# Patient Record
Sex: Female | Born: 1965 | Race: Black or African American | Hispanic: No | Marital: Married | Smoking: Never smoker
Health system: Southern US, Community
[De-identification: ages and names within clinical notes are randomized; demographics above are authoritative.]

## PROBLEM LIST (undated history)

## (undated) DIAGNOSIS — N6019 Diffuse cystic mastopathy of unspecified breast: Secondary | ICD-10-CM

## (undated) DIAGNOSIS — R600 Localized edema: Secondary | ICD-10-CM

## (undated) DIAGNOSIS — I10 Essential (primary) hypertension: Secondary | ICD-10-CM

## (undated) DIAGNOSIS — R609 Edema, unspecified: Secondary | ICD-10-CM

## (undated) HISTORY — DX: Morbid (severe) obesity due to excess calories: E66.01

## (undated) HISTORY — DX: Localized edema: R60.0

## (undated) HISTORY — DX: Essential (primary) hypertension: I10

## (undated) HISTORY — DX: Edema, unspecified: R60.9

## (undated) HISTORY — PX: REDUCTION MAMMAPLASTY: SUR839

## (undated) HISTORY — DX: Diffuse cystic mastopathy of unspecified breast: N60.19

---

## 1998-08-18 ENCOUNTER — Other Ambulatory Visit: Admission: RE | Admit: 1998-08-18 | Discharge: 1998-08-18 | Payer: Self-pay | Admitting: Gynecology

## 1998-08-18 ENCOUNTER — Encounter (INDEPENDENT_AMBULATORY_CARE_PROVIDER_SITE_OTHER): Payer: Self-pay | Admitting: Specialist

## 1998-09-12 ENCOUNTER — Encounter (INDEPENDENT_AMBULATORY_CARE_PROVIDER_SITE_OTHER): Payer: Self-pay | Admitting: Specialist

## 1998-09-12 ENCOUNTER — Ambulatory Visit (HOSPITAL_COMMUNITY): Admission: RE | Admit: 1998-09-12 | Discharge: 1998-09-12 | Payer: Self-pay | Admitting: Obstetrics and Gynecology

## 1998-11-27 ENCOUNTER — Ambulatory Visit (HOSPITAL_COMMUNITY): Admission: RE | Admit: 1998-11-27 | Discharge: 1998-11-27 | Payer: Self-pay | Admitting: Family Medicine

## 1998-11-27 ENCOUNTER — Encounter: Payer: Self-pay | Admitting: Family Medicine

## 1999-02-12 ENCOUNTER — Observation Stay (HOSPITAL_COMMUNITY): Admission: RE | Admit: 1999-02-12 | Discharge: 1999-02-13 | Payer: Self-pay | Admitting: Gynecology

## 1999-02-12 ENCOUNTER — Encounter (INDEPENDENT_AMBULATORY_CARE_PROVIDER_SITE_OTHER): Payer: Self-pay | Admitting: Specialist

## 2000-01-12 HISTORY — PX: ABDOMINAL HYSTERECTOMY: SHX81

## 2001-09-12 ENCOUNTER — Emergency Department (HOSPITAL_COMMUNITY): Admission: EM | Admit: 2001-09-12 | Discharge: 2001-09-13 | Payer: Self-pay | Admitting: Emergency Medicine

## 2001-09-13 ENCOUNTER — Encounter: Payer: Self-pay | Admitting: Emergency Medicine

## 2005-05-04 ENCOUNTER — Emergency Department (HOSPITAL_COMMUNITY): Admission: EM | Admit: 2005-05-04 | Discharge: 2005-05-04 | Payer: Self-pay | Admitting: Emergency Medicine

## 2006-09-20 ENCOUNTER — Ambulatory Visit: Payer: Self-pay | Admitting: Family Medicine

## 2006-09-21 ENCOUNTER — Encounter: Admission: RE | Admit: 2006-09-21 | Discharge: 2006-09-21 | Payer: Self-pay | Admitting: Family Medicine

## 2006-10-12 ENCOUNTER — Encounter: Admission: RE | Admit: 2006-10-12 | Discharge: 2006-10-12 | Payer: Self-pay | Admitting: Family Medicine

## 2006-10-12 LAB — HM MAMMOGRAPHY: HM Mammogram: NEGATIVE

## 2007-05-17 ENCOUNTER — Ambulatory Visit: Payer: Self-pay | Admitting: Family Medicine

## 2007-05-17 ENCOUNTER — Other Ambulatory Visit: Admission: RE | Admit: 2007-05-17 | Discharge: 2007-05-17 | Payer: Self-pay | Admitting: Family Medicine

## 2007-08-28 ENCOUNTER — Ambulatory Visit: Payer: Self-pay | Admitting: Family Medicine

## 2007-12-28 ENCOUNTER — Ambulatory Visit: Payer: Self-pay | Admitting: Family Medicine

## 2008-08-06 ENCOUNTER — Other Ambulatory Visit: Admission: RE | Admit: 2008-08-06 | Discharge: 2008-08-06 | Payer: Self-pay | Admitting: Family Medicine

## 2008-08-06 ENCOUNTER — Ambulatory Visit: Payer: Self-pay | Admitting: Family Medicine

## 2009-03-08 ENCOUNTER — Ambulatory Visit: Payer: Self-pay | Admitting: Diagnostic Radiology

## 2009-03-08 ENCOUNTER — Emergency Department (HOSPITAL_BASED_OUTPATIENT_CLINIC_OR_DEPARTMENT_OTHER): Admission: EM | Admit: 2009-03-08 | Discharge: 2009-03-09 | Payer: Self-pay | Admitting: Emergency Medicine

## 2009-03-12 ENCOUNTER — Ambulatory Visit (HOSPITAL_BASED_OUTPATIENT_CLINIC_OR_DEPARTMENT_OTHER): Admission: RE | Admit: 2009-03-12 | Discharge: 2009-03-12 | Payer: Self-pay | Admitting: Orthopedic Surgery

## 2009-03-12 ENCOUNTER — Ambulatory Visit: Payer: Self-pay | Admitting: Diagnostic Radiology

## 2009-10-28 ENCOUNTER — Ambulatory Visit: Payer: Self-pay | Admitting: Family Medicine

## 2010-02-02 ENCOUNTER — Encounter: Payer: Self-pay | Admitting: Family Medicine

## 2010-05-29 NOTE — Op Note (Signed)
[facility]  Patient:    Courtney Bolton                        MRN: [id number] Proc. Date: 02/12/99 Adm. Date:  [id number] Attending:  Merrily Pew Dictator:   Nadyne Coombes. Fontaine, M.D.                           Operative Report  PREOPERATIVE DIAGNOSES: 1. Menometrorrhagia. 2. Left Bartholin cyst.  POSTOPERATIVE DIAGNOSES: 1. Menometrorrhagia. 2. Left Bartholin cyst.  PROCEDURE:  Total vaginal hysterectomy, marsupialization left Bartholin cyst.  SURGEON:  Dr. Audie Box.  ASSISTANT:  Daniel L. Eda Paschal, M.D.  ANESTHESIA:  General endotracheal anesthesia.  COMPLICATIONS:  None.  ESTIMATED BLOOD LOSS:  Minimal, less than 100 cc.  SPECIMEN:  Uterus, Bartholin cyst biopsy.  FINDINGS:  Uterus overall grossly normal.  Right and left ovaries grossly normal. Right and left fallopian tube segments grossly normal.  Left Bartholin cyst with greenish cyst fluid.  DESCRIPTION OF PROCEDURE:  The patient was taken to the operating room, underwent general endotracheal anesthesia.  Was placed in the dorsolithotomy position. Received a perineal vaginal preparation with Betadine scrub and Betadine solution. The bladder was emptied with in-and-out Foley catheterization, and the patient as draped in the usual fashion.  Cervix was visualized, grasped with the tenaculum, and circumferentially injected with lidocaine with epinephrine.  The cervical mucosa was then sharply incised circumferentially, and the anterior posterior cul-de-sac planes were developed sharply.  The posterior cul-de-sac was then sharply entered without difficulty.  A long weighted speculum was placed, and the right and left uterosacral ligaments were clamped, cut, and ligated using 0 Vicryl suture.  The anterior cul-de-sac was progressively developed through sharp dissection, and ultimately sharply entered without difficulty.  The uterus was progressively freed from its  attachments through clamping, cutting, and ligated of the parametrial tissues and cardinal ligaments using 0 Vicryl suture.  Both the  right and left uterine vessels were identified and doubly ligated using 0 Vicryl suture.  The uterus was then delivered through the vagina.  The uterine ovarian  pedicles clamped, cut, and the uterus removed.  The right and left ovarian pedicles were then doubly ligated using 0 Vicryl suture, achieving hemostasis.  The long  weighted speculum was replaced with a short weighted speculum, and the posterior vaginal mucosa was run using 0 Vicryl suture from uterosacral to uterosacral ligament in a running interlocking stitch.  A McCalls culdoplasty-type stitch using 2-0 Vicryl was then placed.  The vagina was closed anterior to posterior using interrupted 0 Vicryl figure-of-eight sutures, and the McCalls stitch tied. Adequate hemostasis was visualized.  The vagina copiously irrigated, and attention was then turned to the left Bartholin cyst.  The cyst was identified, and the vaginal mucosa overlying the cyst was sharply incised.  The cyst was then entered with greenish cyst fluid extruding, and palpation of the cyst cavity showed a simple cyst.  A representative biopsy of he cyst mucosa was taken, and subsequently the cyst wall was sutured to the overlying vaginal mucosa in a running interlocking stitch to marsupialize the cyst. Adequate hemostasis was visualized.  Again, the vagina was copiously irrigated.  A Foley  catheter was placed.  Clear free-flowing urine noted.  The patient placed in the supine position, awakened without difficulty, and taken to the recovery room in  good condition. DD:  02/12/99 TD:  02/12/99 Job: [id number]

## 2010-06-18 ENCOUNTER — Other Ambulatory Visit: Payer: Self-pay | Admitting: Family Medicine

## 2010-06-18 DIAGNOSIS — I10 Essential (primary) hypertension: Secondary | ICD-10-CM

## 2010-07-20 ENCOUNTER — Other Ambulatory Visit: Payer: Self-pay | Admitting: Family Medicine

## 2010-08-19 ENCOUNTER — Other Ambulatory Visit: Payer: Self-pay | Admitting: Family Medicine

## 2010-09-21 ENCOUNTER — Other Ambulatory Visit: Payer: Self-pay | Admitting: Family Medicine

## 2010-10-20 ENCOUNTER — Other Ambulatory Visit: Payer: Self-pay | Admitting: Family Medicine

## 2010-11-19 ENCOUNTER — Other Ambulatory Visit: Payer: Self-pay | Admitting: Family Medicine

## 2010-12-23 ENCOUNTER — Other Ambulatory Visit: Payer: Self-pay | Admitting: Family Medicine

## 2011-01-21 ENCOUNTER — Other Ambulatory Visit: Payer: Self-pay | Admitting: Family Medicine

## 2011-01-24 ENCOUNTER — Other Ambulatory Visit: Payer: Self-pay | Admitting: Family Medicine

## 2011-01-25 ENCOUNTER — Other Ambulatory Visit: Payer: Self-pay | Admitting: Family Medicine

## 2011-01-27 ENCOUNTER — Encounter: Payer: Self-pay | Admitting: Medical

## 2011-01-27 ENCOUNTER — Ambulatory Visit (INDEPENDENT_AMBULATORY_CARE_PROVIDER_SITE_OTHER): Payer: 59 | Admitting: Medical

## 2011-01-27 VITALS — BP 130/82 | HR 60 | Temp 98.0°F | Resp 16 | Wt 384.0 lb

## 2011-01-27 DIAGNOSIS — Z7189 Other specified counseling: Secondary | ICD-10-CM | POA: Insufficient documentation

## 2011-01-27 DIAGNOSIS — E669 Obesity, unspecified: Secondary | ICD-10-CM

## 2011-01-27 DIAGNOSIS — Z23 Encounter for immunization: Secondary | ICD-10-CM | POA: Insufficient documentation

## 2011-01-27 DIAGNOSIS — I10 Essential (primary) hypertension: Secondary | ICD-10-CM | POA: Insufficient documentation

## 2011-01-27 LAB — COMPREHENSIVE METABOLIC PANEL
AST: 13 U/L (ref 0–37)
Albumin: 4 g/dL (ref 3.5–5.2)
Alkaline Phosphatase: 70 U/L (ref 39–117)
BUN: 9 mg/dL (ref 6–23)
CO2: 28 mEq/L (ref 19–32)
Calcium: 9.2 mg/dL (ref 8.4–10.5)
Chloride: 100 mEq/L (ref 96–112)
Creat: 0.79 mg/dL (ref 0.50–1.10)
Total Protein: 7 g/dL (ref 6.0–8.3)

## 2011-01-27 LAB — POCT URINALYSIS DIPSTICK
Glucose, UA: NEGATIVE
Nitrite, UA: NEGATIVE
Spec Grav, UA: <=1.005
Urobilinogen, UA: NEGATIVE
pH, UA: 8

## 2011-01-27 LAB — LIPID PANEL: Total CHOL/HDL Ratio: 3.1 Ratio

## 2011-01-27 LAB — CBC WITH DIFFERENTIAL/PLATELET
Basophils Relative: 0 % (ref 0–1)
Eosinophils Relative: 2 % (ref 0–5)
Hemoglobin: 11.9 g/dL — ABNORMAL LOW (ref 12.0–15.0)
Lymphocytes Relative: 37 % (ref 12–46)
Monocytes Absolute: 0.6 10*3/uL (ref 0.1–1.0)
Monocytes Relative: 7 % (ref 3–12)
Neutrophils Relative %: 55 % (ref 43–77)
RDW: 14.2 % (ref 11.5–15.5)

## 2011-01-27 LAB — TSH: TSH: 1.053 u[IU]/mL (ref 0.350–4.500)

## 2011-01-27 MED ORDER — LISINOPRIL-HYDROCHLOROTHIAZIDE 20-25 MG PO TABS: 1.0000 | ORAL_TABLET | Freq: Every day | ORAL | Status: DC

## 2011-01-27 NOTE — Patient Instructions (Signed)
Work on diet and exercise changes.

## 2011-01-27 NOTE — Progress Notes (Signed)
  Subjective:   HPI  Courtney Bolton is a 46 y.o. female who presents for general recheck. Last visit here October 2011. She is here primarily today for medication refills for her blood pressure medication. She occasionally checks her blood pressure, not adding much salt in her diet, not really exercising much. She is not up-to-date on preventative care including mammogram Pap smear. She did have a work physical July 2011.  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   No other aggravating or relieving factors.    No other c/o.  The following portions of the patient's history were reviewed and updated as appropriate: allergies, current medications, past family history, past medical history, past social history, past surgical history and problem list.  Past Medical History  Diagnosis Date  . Hypertension   . Morbid obesity   . Peripheral edema   . Fibrocystic breast disease    Review of Systems Constitutional: -fever, -chills, -sweats, -unexpected -weight change,-fatigue ENT: -runny nose, -ear pain, -sore throat Cardiology:  -chest pain, -palpitations, -edema Respiratory: -cough, -shortness of breath, -wheezing Gastroenterology: -abdominal pain, -nausea, -vomiting, -diarrhea, -constipation Hematology: -bleeding or bruising problems Musculoskeletal: -arthralgias, -myalgias, -joint swelling, -back pain Ophthalmology: -vision changes Urology: -dysuria, -difficulty urinating, -hematuria, -urinary frequency, -urgency Neurology: -headache, -weakness, -tingling, -numbness    Objective:   Physical Exam  Filed Vitals:   01/27/11 0859  BP: 130/82  Pulse: 60  Temp: 98 F (36.7 C)  Resp: 16    General appearance: alert, no distress, WD/WN, morbidly obese, AA female HEENT: normocephalic, sclerae anicteric, TMs pearly, nares patent, no discharge or erythema, pharynx normal Oral cavity:  MMM, no lesions Neck: supple, no lymphadenopathy, no thyromegaly, no masses, no bruits Heart: RRR, normal S1, S2, no murmurs Lungs: CTA bilaterally, no wheezes, rhonchi, or rales Abdomen: +bs, soft, non tender, non distended, no masses, no hepatomegaly, no splenomegaly Pulses: 2+ symmetric, upper and lower extremities, normal cap refill   Assessment and Plan :    Encounter Diagnoses  Name Primary?  . Essential hypertension, benign Yes  . Obesity   . Counseling on health promotion and disease prevention   . Need for prophylactic vaccination and inoculation against influenza    Hypertension-continue current medication.  Medication refilled today.  Obesity-discussed the need to reduce her weight with diet and exercise changes, in order to prevent complications such as diabetes or other weight related medical complications  Counseled on preventative care, vaccines, exercise, diet, strategies including 1800-calorie per day diet for now, and getting her weight under control gradually   Influenza vaccine given today, VIS given, and vaccine counseling given  Follow-up pending labs, 20mo.  return soon for breast and pelvic exam, preventative care.

## 2011-01-28 ENCOUNTER — Other Ambulatory Visit: Payer: Self-pay | Admitting: Medical

## 2011-01-28 LAB — HEMOGLOBIN A1C
Hgb A1c MFr Bld: 6.6 % — ABNORMAL HIGH (ref <5.7)
Mean Plasma Glucose: 143 mg/dL — ABNORMAL HIGH (ref <117)

## 2011-01-28 MED ORDER — METFORMIN HCL 500 MG PO TABS: ORAL_TABLET | ORAL | Status: DC

## 2011-02-24 ENCOUNTER — Encounter: Payer: Self-pay | Admitting: Internal Medicine

## 2011-03-01 ENCOUNTER — Encounter: Payer: Self-pay | Admitting: Medical

## 2011-03-01 ENCOUNTER — Ambulatory Visit (INDEPENDENT_AMBULATORY_CARE_PROVIDER_SITE_OTHER): Payer: 59 | Admitting: Medical

## 2011-03-01 VITALS — BP 118/80 | HR 72 | Temp 98.5°F | Resp 16 | Wt 350.0 lb

## 2011-03-01 DIAGNOSIS — E119 Type 2 diabetes mellitus without complications: Secondary | ICD-10-CM

## 2011-03-01 DIAGNOSIS — N63 Unspecified lump in unspecified breast: Secondary | ICD-10-CM

## 2011-03-01 DIAGNOSIS — B373 Candidiasis of vulva and vagina: Secondary | ICD-10-CM

## 2011-03-01 DIAGNOSIS — Z23 Encounter for immunization: Secondary | ICD-10-CM

## 2011-03-01 DIAGNOSIS — R300 Dysuria: Secondary | ICD-10-CM

## 2011-03-01 LAB — POCT URINALYSIS DIPSTICK: Glucose, UA: NEGATIVE

## 2011-03-01 MED ORDER — NITROFURANTOIN MONOHYD MACRO 100 MG PO CAPS: 100.0000 mg | ORAL_CAPSULE | Freq: Two times a day (BID) | ORAL | Status: AC

## 2011-03-01 MED ORDER — FLUCONAZOLE 150 MG PO TABS: 150.0000 mg | ORAL_TABLET | Freq: Once | ORAL | Status: AC

## 2011-03-01 NOTE — Progress Notes (Signed)
  Subjective:   HPI  Courtney Bolton is a 46 y.o. female who presents for 71mo recheck on labs, new diabetes diagnosis.  She has begun some diet and exercise changes since last visit.  Has appt with dietician pending.  She notes 2 days ago began getting annoying dull ache in left lower abdomen.  Has had this in the remote past lasting for a week or two, but usually goes away on its own.  She notes some urinary pressure.  Otherwise no new c/o. She has started Metformin.  Had diarrhea the first week or two but that resolved.  No other aggravating or relieving factors.    No other c/o.  The following portions of the patient's history were reviewed and updated as appropriate: allergies, current medications, past family history, past medical history, past social history, past surgical history and problem list.  Past Medical History  Diagnosis Date  . Hypertension   . Morbid obesity   . Peripheral edema   . Fibrocystic breast disease   . Diabetes mellitus     Diabetes type II dx 01/2011    Allergies  Allergen Reactions  . Sulfa Antibiotics      Review of Systems Gen: no fever, chills, sweats Skin: no rash GU: urinary pressure, but no frequency, odor, burning or hematuria GI: mild diarrhea, but no constipation, blood, change otherwise in bowel function Neuro: negative Heart: no CP, palpitations,edema Lungs: negative    Objective:   Physical Exam  General appearance: alert, no distress, WD/WN, obese Oral cavity: MMM, no lesions Neck: supple, no lymphadenopathy, no thyromegaly, no masses Heart: RRR, normal S1, S2, no murmurs Lungs: CTA bilaterally, no wheezes, rhonchi, or rales Abdomen: +bs, soft, +suprapubic tenderness, non distended, no masses, no hepatomegaly, no splenomegaly Back : nontender Pulses: 2+ symmetric, upper and lower extremities, normal cap refill Breast: nontender, right breast 4 o'clock with 1cm nodule, left lateral breast at o'clock and 2 o'clock with  small nodules, all three likely fibrocystic changes, no skin changes, no nipple discharge or inversion, no axillary lymphadenopathy Gyn: Normal external genitalia without lesions, vagina with normal mucosa, s/p hysterectomy, whitish creamy discharge.  Adnexa not enlarged, nontender, no masses.  Wet prep swab taken.  Exam chaperoned by nurse. Rectal: deferred    Assessment and Plan :     Encounter Diagnoses  Name Primary?  . Type II or unspecified type diabetes mellitus without mention of complication, not stated as uncontrolled Yes  . Dysuria   . Need for hepatitis B vaccination   . Need for pneumococcal vaccination   . Yeast vaginitis   . Breast lump    Diabetes type II -  Discussed the recent labs, new diagnosis, medications, guidelines and goals for diabetes, and possible complications.  Answered her questions.  She will f/u with dietician, will work on lifestyle changes, will c/t Metformin, and recheck here in 91mo.  Dysuria - empirically began Macrobid today for UTI, culture sent  Hep B vaccine given today, VIS and counseling given.    Pneumococcal vaccine given today, VIS and counseling given  Vaginitis - script for Diflucan  Breast lump - diagnostic mammogram

## 2011-03-01 NOTE — Progress Notes (Signed)
Addended by: Janeice Robinson on: 03/01/2011 03:10 PM   Modules accepted: Orders

## 2011-03-01 NOTE — Patient Instructions (Signed)
Today's plan: 1) begin Diflucan for yeast infection 2) begin Macrobid twice daily for likely urinary tract infection 3) we are going to set you up for diagnostic mammogram  Diabetes recommendations:  Yearly eye exam  Check feet daily for sores/wounds  Work on diet and exercise to improve diabetes  Continue Metformin twice daily and recheck labs here and office visit in 4 months  Follow up with the dietician as planned  Diabetes, Frequently Asked Questions WHAT IS DIABETES? Most of the food we eat is turned into glucose (sugar). Our bodies use it for energy. The pancreas makes a hormone called insulin. It helps glucose get into the cells of our bodies. When you have diabetes, your body either does not make enough insulin or cannot use its own insulin as well as it should. This causes sugars to build up in your blood. WHAT ARE THE SYMPTOMS OF DIABETES?  Frequent urination.   Excessive thirst.   Unexplained weight loss.   Extreme hunger.   Blurred vision.   Tingling or numbness in hands or feet.   Feeling very tired much of the time.   Dry, itchy skin.   Sores that are slow to heal.   Yeast infections.  WHAT ARE THE TYPES OF DIABETES? Type 1 Diabetes   About 10% of affected people have this type.   Usually occurs before the age of 44.   Usually occurs in thin to normal weight people.  Type 2 Diabetes  About 90% of affected people have this type.   Usually occurs after the age of 48.   Usually occurs in overweight people.   More likely to have:   A family history of diabetes.   A history of diabetes during pregnancy (gestational diabetes).   High blood pressure.   High cholesterol and triglycerides.  Gestational Diabetes  Occurs in about 4% of pregnancies.   Usually goes away after the baby is born.   More likely to occur in women with:   Family history of diabetes.   Previous gestational diabetes.   Obese.   Over 21 years old.  WHAT IS  PRE-DIABETES? Pre-diabetes means your blood glucose is higher than normal, but lower than the diabetes range. It also means you are at risk of getting type 2 diabetes and heart disease. If you are told you have pre-diabetes, have your blood glucose checked again in 1 to 2 years. WHAT IS THE TREATMENT FOR DIABETES? Treatment is aimed at keeping blood glucose near normal levels at all times. Learning how to manage this yourself is important in treating diabetes. Depending on the type of diabetes you have, your treatment will include one or more of the following:  Monitoring your blood glucose.   Meal planning.   Exercise.   Oral medicine (pills) or insulin.  CAN DIABETES BE PREVENTED? With type 1 diabetes, prevention is more difficult, because the triggers that cause it are not yet known. With type 2 diabetes, prevention is more likely, with lifestyle changes:  Maintain a healthy weight.   Eat healthy.   Exercise.  IS THERE A CURE FOR DIABETES? No, there is no cure for diabetes. There is a lot of research going on that is looking for a cure, and progress is being made. Diabetes can be treated and controlled. People with diabetes can manage their diabetes and lead normal, active lives. SHOULD I BE TESTED FOR DIABETES? If you are at least 45 years old, you should be tested for diabetes. You should be  tested again every 3 years. If you are 45 or older and overweight, you may want to get tested more often. If you are younger than 45, overweight, and have one or more of the following risk factors, you should be tested:  Family history of diabetes.   Inactive lifestyle.   High blood pressure.  WHAT ARE SOME OTHER SOURCES FOR INFORMATION ON DIABETES? The following organizations may help in your search for more information on diabetes: National Diabetes Education Program (NDEP) Internet: SolarDiscussions.es American Diabetes Association Internet: http://www.diabetes.org    Juvenile Diabetes Foundation International Internet: WetlessWash.is Document Released: 12/31/2002 Document Revised: 09/09/2010 Document Reviewed: 10/25/2008 [facility] Patient Information 2012 [place], [place].   Diabetes and Exercise Regular exercise is important and can help:   Control blood glucose (sugar).   Decrease blood pressure.    Control blood lipids (cholesterol, triglycerides).   Improve overall health.  BENEFITS FROM EXERCISE  Improved fitness.   Improved flexibility.   Improved endurance.   Increased bone density.   Weight control.   Increased muscle strength.   Decreased body fat.   Improvement of the body's use of insulin, a hormone.   Increased insulin sensitivity.   Reduction of insulin needs.   Reduced stress and tension.   Helps you feel better.  People with diabetes who add exercise to their lifestyle gain additional benefits, including:  Weight loss.   Reduced appetite.   Improvement of the body's use of blood glucose.   Decreased risk factors for heart disease:   Lowering of cholesterol and triglycerides.   Raising the level of good cholesterol (high-density lipoproteins, HDL).   Lowering blood sugar.   Decreased blood pressure.  TYPE 1 DIABETES AND EXERCISE  Exercise will usually lower your blood glucose.   If blood glucose is greater than 240 mg/dl, check urine ketones. If ketones are present, do not exercise.   Location of the insulin injection sites may need to be adjusted with exercise. Avoid injecting insulin into areas of the body that will be exercised. For example, avoid injecting insulin into:   The arms when playing tennis.   The legs when jogging. For more information, discuss this with your caregiver.   Keep a record of:   Food intake.   Type and amount of exercise.   Expected peak times of insulin action.   Blood glucose levels.  Do this before, during, and after exercise. Review your records with  your caregiver. This will help you to develop guidelines for adjusting food intake and insulin amounts.  TYPE 2 DIABETES AND EXERCISE  Regular physical activity can help control blood glucose.   Exercise is important because it may:   Increase the body's sensitivity to insulin.   Improve blood glucose control.   Exercise reduces the risk of heart disease. It decreases serum cholesterol and triglycerides. It also lowers blood pressure.   Those who take insulin or oral hypoglycemic agents should watch for signs of hypoglycemia. These signs include dizziness, shaking, sweating, chills, and confusion.   Body water is lost during exercise. It must be replaced. This will help to avoid loss of body fluids (dehydration) or heat stroke.  Be sure to talk to your caregiver before starting an exercise program to make sure it is safe for you. Remember, any activity is better than none.  Document Released: 03/20/2003 Document Revised: 09/09/2010 Document Reviewed: 07/04/2008 [facility] Patient Information 2012 [place], [place].

## 2011-03-05 LAB — URINE CULTURE

## 2011-03-09 ENCOUNTER — Ambulatory Visit: Admission: RE | Admit: 2011-03-09 | Payer: 59 | Source: Ambulatory Visit

## 2011-03-09 ENCOUNTER — Ambulatory Visit
Admission: RE | Admit: 2011-03-09 | Discharge: 2011-03-09 | Disposition: A | Discharge status: Home / Self Care | Payer: 59 | Source: Ambulatory Visit | Attending: Medical | Admitting: Medical

## 2011-03-09 ENCOUNTER — Other Ambulatory Visit: Payer: Self-pay | Admitting: Medical

## 2011-03-09 DIAGNOSIS — N63 Unspecified lump in unspecified breast: Secondary | ICD-10-CM

## 2011-04-19 ENCOUNTER — Ambulatory Visit: Payer: 59 | Admitting: *Deleted

## 2011-05-04 LAB — HM COLONOSCOPY

## 2011-06-02 ENCOUNTER — Encounter: Payer: Self-pay | Admitting: Medical

## 2011-06-02 ENCOUNTER — Ambulatory Visit (INDEPENDENT_AMBULATORY_CARE_PROVIDER_SITE_OTHER): Payer: 59 | Admitting: Medical

## 2011-06-02 VITALS — BP 108/80 | HR 80 | Temp 98.0°F | Resp 16 | Wt 368.0 lb

## 2011-06-02 DIAGNOSIS — H6123 Impacted cerumen, bilateral: Secondary | ICD-10-CM

## 2011-06-02 DIAGNOSIS — H6120 Impacted cerumen, unspecified ear: Secondary | ICD-10-CM

## 2011-06-02 NOTE — Progress Notes (Signed)
Subjective:   HPI Here for complaint of earwax buildup in both ears, hearing seems muffled.  She notes prior history of similar.  Wants ears cleaned up.  No other complaint.  Review of Systems Constitutional: denies fever, chills, sweats ENT: no runny nose, ear pain, sore throat, hoarseness, sinus pain, teeth pain, tinnitus, hearing loss Gastroenterology: denies nausea, vomiting     Objective:   Physical Exam  General appearance: alert, no distress, WD/WN HEENT: bilat ear canal with impacted cerumen    Assessment & Plan:    Encounter Diagnosis  Name Primary?  . Impacted cerumen of both ears Yes    Discussed risk/benefits of procedure and patient agrees to procedure. Successfully used warm water lavage to remove impacted cerumen from both ear canals.  Patient tolerated procedure well.  Call or return if worse.

## 2011-06-19 ENCOUNTER — Emergency Department (HOSPITAL_BASED_OUTPATIENT_CLINIC_OR_DEPARTMENT_OTHER)
Admission: EM | Admit: 2011-06-19 | Discharge: 2011-06-20 | Disposition: A | Discharge status: Home / Self Care | Payer: 59 | Attending: Emergency Medicine | Admitting: Emergency Medicine

## 2011-06-19 ENCOUNTER — Encounter (HOSPITAL_BASED_OUTPATIENT_CLINIC_OR_DEPARTMENT_OTHER): Payer: Self-pay | Admitting: *Deleted

## 2011-06-19 DIAGNOSIS — E119 Type 2 diabetes mellitus without complications: Secondary | ICD-10-CM | POA: Insufficient documentation

## 2011-06-19 DIAGNOSIS — Z79899 Other long term (current) drug therapy: Secondary | ICD-10-CM | POA: Insufficient documentation

## 2011-06-19 DIAGNOSIS — R220 Localized swelling, mass and lump, head: Secondary | ICD-10-CM | POA: Insufficient documentation

## 2011-06-19 DIAGNOSIS — T783XXA Angioneurotic edema, initial encounter: Secondary | ICD-10-CM

## 2011-06-19 DIAGNOSIS — I10 Essential (primary) hypertension: Secondary | ICD-10-CM | POA: Insufficient documentation

## 2011-06-19 NOTE — ED Notes (Signed)
Pt states she noticed her lower lip swelling about 1p today. Also c/o chest tightness off and on x 3-4 weeks

## 2011-06-20 ENCOUNTER — Other Ambulatory Visit: Payer: Self-pay

## 2011-06-20 MED ORDER — FAMOTIDINE 20 MG PO TABS
40.0000 mg | ORAL_TABLET | Freq: Once | ORAL | Status: AC
  Administered 2011-06-20: 40 mg via ORAL
  Filled 2011-06-20: qty 2

## 2011-06-20 MED ORDER — HYDROCHLOROTHIAZIDE 25 MG PO TABS: 25.0000 mg | ORAL_TABLET | Freq: Every day | ORAL | Status: DC

## 2011-06-20 MED ORDER — DIPHENHYDRAMINE HCL 25 MG PO CAPS
50.0000 mg | ORAL_CAPSULE | Freq: Once | ORAL | Status: AC
  Administered 2011-06-20: 50 mg via ORAL
  Filled 2011-06-20: qty 2

## 2011-06-20 MED ORDER — PREDNISONE 50 MG PO TABS
60.0000 mg | ORAL_TABLET | Freq: Once | ORAL | Status: AC
  Administered 2011-06-20: 60 mg via ORAL
  Filled 2011-06-20: qty 1

## 2011-06-20 NOTE — ED Notes (Signed)
MD at bedside. 

## 2011-06-20 NOTE — ED Provider Notes (Signed)
History     CSN: [id number]  Arrival date & time 06/19/11  2342   First MD Initiated Contact with Patient 06/20/11 0235      Chief Complaint  Patient presents with  . Oral Swelling    (Consider location/radiation/quality/duration/timing/severity/associated sxs/prior treatment) HPI This is a 46 year old black female who is on lisinopril and hydrochlorothiazide for hypertension. She complains of swelling of her lower lip that began yesterday afternoon about 1 PM. It initiated on the right side but then generalized. The symptoms are mild. Once the entire lower lip was involved the symptoms did not worsen. There is no swelling of the tongue or throat. There is no dyspnea. She has never had this happen in the past. There is no pain associated with it.  She also mentions that she has had some chest discomfort for about the last 3-4 weeks. The discomfort is located to the left of the sternum and is described as a tightness that lasts about one to 2 seconds. It is not associated with exertion or movement. There is no associated dyspnea, diaphoresis or nausea.  Past Medical History  Diagnosis Date  . Hypertension   . Morbid obesity   . Peripheral edema   . Fibrocystic breast disease   . Diabetes mellitus     Diabetes type II dx 01/2011    Past Surgical History  Procedure Date  . Abdominal hysterectomy 2002    Dr. Audie Box; fibroids    Family History  Problem Relation Age of Onset  . Diabetes Mother   . Hypertension Mother   . Diabetes Sister   . Hypertension Sister   . Cancer Maternal Grandmother     luekemia  . Heart disease Maternal Grandfather   . Stroke Neg Hx     History  Substance Use Topics  . Smoking status: Never Smoker   . Smokeless tobacco: Not on file  . Alcohol Use: No    OB History    Grav Para Term Preterm Abortions TAB SAB Ect Mult Living                  Review of Systems  All other systems reviewed and are negative.    Allergies  Sulfa  antibiotics  Home Medications   Current Outpatient Rx  Name Route Sig Dispense Refill  . LISINOPRIL-HYDROCHLOROTHIAZIDE 20-25 MG PO TABS Oral Take 1 tablet by mouth daily. 90 tablet 1  . METFORMIN HCL 500 MG PO TABS  1 tablet daily for 3 days then go to BID 60 tablet 2    BP 122/98  Pulse 94  Temp(Src) 98.3 F (36.8 C) (Oral)  Resp 20  Ht 5\' 11"  (1.803 m)  Wt 378 lb (171.46 kg)  BMI 52.72 kg/m2  SpO2 100%  Physical Exam General: Well-developed, obese female in no acute distress; appearance consistent with age of record HENT: normocephalic, atraumatic; mild angioedema of lower lip Eyes: pupils equal round and reactive to light; extraocular muscles intact Neck: supple Heart: regular rate and rhythm Lungs: clear to auscultation bilaterally Abdomen: soft; obese; nontender Extremities: No deformity; full range of motion Neurologic: Awake, alert and oriented; motor function intact in all extremities and symmetric; no facial droop Skin: Warm and dry Psychiatric: Normal mood and affect    ED Course  Procedures (including critical care time)     MDM    EKG Interpretation:  Date & Time: 06/20/2011 12:05 AM  Rate: 102  Rhythm: sinus tachycardia  QRS Axis: normal  Intervals: normal  ST/T  Wave abnormalities: normal  Conduction Disutrbances:none  Narrative Interpretation:   Old EKG Reviewed: none available  2:50 AM We will discontinue the patient's lisinopril. She was advised that she will need to discuss medication changes with her primary care physician as she should avoid ACE inhibitors in the future.        Hanley Seamen, MD 06/20/11 [phone]

## 2011-06-21 ENCOUNTER — Encounter: Payer: Self-pay | Admitting: Medical

## 2011-06-21 ENCOUNTER — Ambulatory Visit (INDEPENDENT_AMBULATORY_CARE_PROVIDER_SITE_OTHER): Payer: 59 | Admitting: Medical

## 2011-06-21 VITALS — BP 124/86 | HR 64 | Temp 97.6°F | Resp 16 | Wt 366.0 lb

## 2011-06-21 DIAGNOSIS — I10 Essential (primary) hypertension: Secondary | ICD-10-CM

## 2011-06-21 DIAGNOSIS — J329 Chronic sinusitis, unspecified: Secondary | ICD-10-CM

## 2011-06-21 DIAGNOSIS — T783XXA Angioneurotic edema, initial encounter: Secondary | ICD-10-CM

## 2011-06-21 MED ORDER — AMOXICILLIN 875 MG PO TABS: 875.0000 mg | ORAL_TABLET | Freq: Two times a day (BID) | ORAL | Status: AC

## 2011-06-21 NOTE — Progress Notes (Signed)
  Subjective:   HPI  Courtney Bolton is a 46 y.o. female who presents for ED f/u.  Went to the emergency dept yesterday for acute onset of lip swelling. Her mother had prior angioedema after years of use of Lisinopril.  Mrs. Kellett has been on Lisinopril for years but the swelling began acutely this weekend.  No recent food changes or other exposures.  ED felt that the angioedema was due to the Lisinopril and switched her to just HCTZ.  She has begun this and stopped the Lisinopril. She was also given prednisone and benadryl x 1 in the ED.  No additional angioedema noted since.    She also c/o several day hx/o sinus pressure, purulent nasal discharge, headache, fatigue, and worsening.  She notes 1-2 sinus infections yearly.  Using OTC decongestant without relief.  No other aggravating or relieving factors.    No other c/o.  The following portions of the patient's history were reviewed and updated as appropriate: allergies, current medications, past family history, past medical history, past social history, past surgical history and problem list.  Past Medical History  Diagnosis Date  . Hypertension   . Morbid obesity   . Peripheral edema   . Fibrocystic breast disease   . Diabetes mellitus     Diabetes type II dx 01/2011    Allergies  Allergen Reactions  . Lisinopril Swelling    Lips swollen  . Sulfa Antibiotics      Review of Systems ROS reviewed and was negative other than noted in HPI or above.    Objective:   Physical Exam  General appearance: alert, no distress, WD/WN HEENT: normocephalic, sclerae anicteric, sinus tenderness+. TMs pearly, nares patent, no discharge or erythema, pharynx normal Oral cavity: MMM, no lesions Neck: supple, no lymphadenopathy, no thyromegaly, no masses Heart: RRR, normal S1, S2, no murmurs Lungs: CTA bilaterally, no wheezes, rhonchi, or rales Pulses: 2+ symmetric, upper and lower extremities, normal cap refill   Assessment and  Plan :     Encounter Diagnoses  Name Primary?  . Sinusitis Yes  . Angioedema   . Essential hypertension, benign    Sinusitis - script for amoxicillin, rest, hydrate well, discussed symptomatic therapy, call /return if worse or not improving;  Angioedema - resolved since stopping Lisinopril.  Reviewed ED report from yesterday.  HTN - c/t HCTZ and recheck 69mo.

## 2011-06-27 ENCOUNTER — Other Ambulatory Visit: Payer: Self-pay | Admitting: Medical

## 2011-07-21 ENCOUNTER — Ambulatory Visit (INDEPENDENT_AMBULATORY_CARE_PROVIDER_SITE_OTHER): Payer: 59 | Admitting: Medical

## 2011-07-21 ENCOUNTER — Encounter: Payer: Self-pay | Admitting: Medical

## 2011-07-21 VITALS — BP 128/88 | HR 60 | Temp 98.2°F | Resp 16 | Wt 363.0 lb

## 2011-07-21 DIAGNOSIS — I10 Essential (primary) hypertension: Secondary | ICD-10-CM

## 2011-07-21 DIAGNOSIS — E119 Type 2 diabetes mellitus without complications: Secondary | ICD-10-CM

## 2011-07-21 LAB — BASIC METABOLIC PANEL
BUN: 11 mg/dL (ref 6–23)
CO2: 29 mEq/L (ref 19–32)
Calcium: 9.3 mg/dL (ref 8.4–10.5)
Chloride: 103 mEq/L (ref 96–112)
Creat: 0.8 mg/dL (ref 0.50–1.10)
Potassium: 4.2 mEq/L (ref 3.5–5.3)
Sodium: 139 mEq/L (ref 135–145)

## 2011-07-21 NOTE — Patient Instructions (Signed)
Kristian Covey, PA-C Phone: [phone] Fax: [phone] July 21, 2011   Courtney Bolton [address] [place] [address]    Dear [name]:  To help you develop diabetes management strategies, we would like to remind you of the following important guidelines:  1) You should have blood work done at least twice yearly to monitor your Hemoglobin A1C (a three-month average of your blood sugars) and your cholesterol.   2) You should have your urine checked yearly to watch for kidney damage. Diabetes can cause kidney failure, which could require frequent dialysis.  3) You should see an eye doctor and a foot doctor yearly to help prevent diabetic blood vessel complications in your eyes and feet.  4) You should receive a yearly flu shot (between October and March). We also recommend that you receive a pneumonia shot once every ten years, or at least after age 87.  5) Make sure your blood pressure is controlled (less than 130/80). Monitoring your blood pressure with a home blood pressure cuff of your own is an excellent idea for diabetics.  6) If you smoke, quitting will reduce your risk of heart attack and stroke.  7) Exercise regularly since it has beneficial effects on the heart and blood sugars. Exercising three times per week can be as important as medication to a diabetic.   We have included a personalized diabetic report card on the following page to help you assess what you're currently doing well and which areas need improvement.  Thank you for including [place] as members of your health care team.  Sincerely,   Kristian Covey PA-C  Enclosure    Diabetic Report Card for [facility]  July 21, 2011   Below is a summary of recent tests related to your diabetes that can help you manage your health.   Hemoglobin A1C:  Your Hemoglobin A1C values should be less than 7. If these are greater than 7, you have a higher chance of having eye, heart, and kidney  problems in the future.   Your most recent Hemoglobin A1C values were:  Hemoglobin A1C (%)  Date Value  01/27/2011 6.6*    Cholesterol:  Your LDL Cholesterol (bad cholesterol) values should be less than 100. If they are consistently higher than 100, then your risk of heart attack and stroke increases yearly.   Your most recent LDL Cholesterol (bad cholesterol) results were:  LDL Cholesterol (mg/dL)  Date Value  02/10/8655 109*    Blood Pressure:  Your blood pressure values should be less than 130/80. Please contact me if your readings at home are consistently higher than this.   Your most recent blood pressure readings at our clinic were:  BP Readings from Last 3 Encounters:  07/21/11 128/88  06/21/11 124/86  06/19/11 122/98

## 2011-07-21 NOTE — Progress Notes (Signed)
Subjective:   HPI  Courtney Bolton is a 46 y.o. female who presents for recheck on new diagnosis of diabetes in January and recheck on hypertension after being on HCTZ x 1 mo.   We had to switch BP medications as she had angioedema from Lisinopril.  She has had no more angioedema, doing well with HCTZ.  Since her diagnosis of diabetes in January, her and husband have made diet changes, cut out sweets, cola, and eating healthier.  She is not checking her sugars but she does have a glucometer.  She is exercising some with walking.  No other c/o.  The following portions of the patient's history were reviewed and updated as appropriate: allergies, current medications, past family history, past medical history, past social history, past surgical history and problem list.  Past Medical History  Diagnosis Date  . Hypertension   . Morbid obesity   . Peripheral edema   . Fibrocystic breast disease   . Diabetes mellitus     Diabetes type II dx 01/2011    Allergies  Allergen Reactions  . Lisinopril Swelling    Lips swollen  . Sulfa Antibiotics      Review of Systems ROS reviewed and was negative other than noted in HPI or above.    Objective:   Physical Exam  General appearance: alert, no distress, WD/WN Heart: RRR, normal S1, S2, no murmurs Lungs: CTA bilaterally, no wheezes, rhonchi, or rales Abdomen: +bs, soft, non tender, non distended, no masses, no hepatomegaly, no splenomegaly Pulses: 2+ symmetric, upper and lower extremities, normal cap refill Ext: no edema   Assessment and Plan :     Encounter Diagnoses  Name Primary?  . Type II or unspecified type diabetes mellitus without mention of complication, not stated as uncontrolled Yes  . Essential hypertension, benign     Diabetes type II - new diagnosis January.  C/t Metformin, HgbA1C today, discussed labs, goals for diabetes care, diet, exercise.  She has made some diet changes, is exercising, but I advised she limit  breads.  Handout given.  HTN - doing well on HCTZ.  BMET today.

## 2011-08-24 ENCOUNTER — Telehealth: Payer: Self-pay | Admitting: Internal Medicine

## 2011-08-24 MED ORDER — HYDROCHLOROTHIAZIDE 25 MG PO TABS: 25.0000 mg | ORAL_TABLET | Freq: Every day | ORAL | Status: DC

## 2011-08-24 NOTE — Telephone Encounter (Signed)
RX FOR BP MEDICATION WAS SENT TO HER PHARMACY. CLS

## 2012-03-06 ENCOUNTER — Other Ambulatory Visit: Payer: Self-pay | Admitting: Medical

## 2012-03-06 NOTE — Telephone Encounter (Signed)
PATIENT NEEDS A FOLLOW UP APPOINTMENT FOR HER HYPERTENSION.

## 2012-04-04 ENCOUNTER — Other Ambulatory Visit: Payer: Self-pay | Admitting: Medical

## 2012-04-06 ENCOUNTER — Other Ambulatory Visit: Payer: Self-pay

## 2012-04-06 ENCOUNTER — Other Ambulatory Visit: Payer: Self-pay | Admitting: Medical

## 2012-04-06 MED ORDER — HYDROCHLOROTHIAZIDE 25 MG PO TABS: ORAL_TABLET | ORAL | Status: DC

## 2012-04-06 NOTE — Telephone Encounter (Signed)
Give her enough for one month. She needs to be scheduled for followup on diabetes

## 2012-04-06 NOTE — Telephone Encounter (Signed)
Pt needs refill HCTZ [facility]  Pt is out of medication, [facility] told [facility] have sent [place] 2 refill requests this week but we have not responded

## 2012-04-06 NOTE — Telephone Encounter (Signed)
LEFT MESSAGE FOR PT SHE MUST HAVE APT FOR DIABETES AND MED CHECK BEFORE ANYMORE REFILLS

## 2012-05-08 ENCOUNTER — Encounter: Payer: Self-pay | Admitting: Medical

## 2012-05-08 ENCOUNTER — Ambulatory Visit (INDEPENDENT_AMBULATORY_CARE_PROVIDER_SITE_OTHER): Payer: 59 | Admitting: Medical

## 2012-05-08 VITALS — BP 118/80 | HR 68 | Temp 98.1°F | Resp 16 | Wt 355.0 lb

## 2012-05-08 DIAGNOSIS — E119 Type 2 diabetes mellitus without complications: Secondary | ICD-10-CM

## 2012-05-08 DIAGNOSIS — I10 Essential (primary) hypertension: Secondary | ICD-10-CM

## 2012-05-08 MED ORDER — HYDROCHLOROTHIAZIDE 25 MG PO TABS: ORAL_TABLET | ORAL | Status: AC

## 2012-05-08 MED ORDER — PHENTERMINE-TOPIRAMATE ER 3.75-23 MG PO CP24: 1.0000 | ORAL_CAPSULE | Freq: Every day | ORAL | Status: DC

## 2012-05-08 MED ORDER — METFORMIN HCL 500 MG PO TABS: 500.0000 mg | ORAL_TABLET | Freq: Two times a day (BID) | ORAL | Status: AC

## 2012-05-08 MED ORDER — PHENTERMINE-TOPIRAMATE ER 7.5-46 MG PO CP24: 1.0000 | ORAL_CAPSULE | Freq: Every day | ORAL | Status: DC

## 2012-05-08 NOTE — Progress Notes (Signed)
Subjective:    Courtney Bolton is a 47 y.o. female who presents for follow-up of Type 2 diabetes mellitus.    Home blood sugar records: fasting range: 90-100 morning;  checks after dinner, post prandial, typically 230-240 around 2 hours after dinner  Current symptoms/problems include none and have been unchanged. Daily foot checks, foot concerns: checks feet, no concerns Last eye exam:  Within a year    Medication compliance: Current diet: in general, a "healthy" diet  , sometimes doesn't eat what she is supposed to do.   does stress eat at times Current exercise: none Known diabetic complications: none Cardiovascular risk factors: diabetes mellitus, hypertension, obesity (BMI >= 30 kg/m2) and sedentary lifestyle  obesity - Been doing weight watchers program in general.  Years ago had tried Phen Phen.    The following portions of the patient's history were reviewed and updated as appropriate: allergies, current medications, past family history, past medical history, past social history, past surgical history and problem list.  ROS as in subjective above  Past Medical History  Diagnosis Date  . Hypertension   . Morbid obesity   . Peripheral edema   . Fibrocystic breast disease   . Diabetes mellitus     Diabetes type II dx 01/2011      Objective:    BP 118/80  Pulse 68  Temp(Src) 98.1 F (36.7 C) (Oral)  Resp 16  Wt 355 lb (161.027 kg)  BMI 49.53 kg/m2  Filed Vitals:   05/08/12 1341  BP: 118/80  Pulse: 68  Temp: 98.1 F (36.7 C)  Resp: 16    General appearance: alert, no distress, WD/WN Neck: supple, no lymphadenopathy, no thyromegaly, no masses Heart: RRR, normal S1, S2, no murmurs Lungs: CTA bilaterally, no wheezes, rhonchi, or rales Pulses: 2+ symmetric, upper and lower extremities, normal cap refill Ext: no edema Foot exam: Neuro: foot monofilament exam normal, no obvious deformity, callous or other   Lab Review Lab Results  Component Value  Date   HGBA1C 6.6* 07/21/2011   Lab Results  Component Value Date   CHOL 183 01/27/2011   HDL 60 01/27/2011   LDLCALC 109* 01/27/2011   TRIG 71 01/27/2011   CHOLHDL 3.1 01/27/2011   No results found for this basenameConcepcion Bolton     Chemistry      Component Value Date/Time   NA 139 07/21/2011 0900   K 4.2 07/21/2011 0900   CL 103 07/21/2011 0900   CO2 29 07/21/2011 0900   BUN 11 07/21/2011 0900   CREATININE 0.80 07/21/2011 0900      Component Value Date/Time   CALCIUM 9.3 07/21/2011 0900   ALKPHOS 70 01/27/2011 0940   AST 13 01/27/2011 0940   ALT 11 01/27/2011 0940   BILITOT 0.4 01/27/2011 0940        Chemistry      Component Value Date/Time   NA 139 07/21/2011 0900   K 4.2 07/21/2011 0900   CL 103 07/21/2011 0900   CO2 29 07/21/2011 0900   BUN 11 07/21/2011 0900   CREATININE 0.80 07/21/2011 0900      Component Value Date/Time   CALCIUM 9.3 07/21/2011 0900   ALKPHOS 70 01/27/2011 0940   AST 13 01/27/2011 0940   ALT 11 01/27/2011 0940   BILITOT 0.4 01/27/2011 0940       Assessment:   Encounter Diagnoses  Name Primary?  . Type II or unspecified type diabetes mellitus without mention of complication, not stated as uncontrolled Yes  .  Essential hypertension, benign   . Morbid obesity      Plan:    1.  Rx changes: none 2.  Education: Reviewed 'ABCs' of diabetes management (respective goals in parentheses):  A1C (<7), blood pressure (<130/80), and cholesterol (LDL <100). 3.  Compliance at present is estimated to be good. Efforts to improve compliance (if necessary) will be directed at dietary modifications: c/t weight watchers, begin trial of Qsymia and increased exercise.  Discussed risks/benefits of Qsymia, efforts/goals for weight loss. 4. Follow up: 2 months .   Return this week fasting for routine labs.

## 2012-05-08 NOTE — Patient Instructions (Signed)
Continue current medications.  Work on getting daily exercise - walking, biking, aerobics, etc.  Eat a healthy diabetic diet.   Continue Weight Watchers program.  Begin Qsymia to help with weight loss goals.    Recheck in 2 months on Qsymia.   Phentermine; Topiramate extended-release capsules (Qsymia) What is this medicine? Phentermine; topiramate (FEN ter meen; toe PYRE a mate) is a combination of two medicines used with a reduced calorie diet and exercise to help you lose weight. This medicine is only available through certified pharmacies enrolled in a special program. Your healthcare professional will tell you where you can get your medicine. If you have additional questions, you can visit the manufacture's website at [email or web address] or contact them by phone at [phone]. This medicine may be used for other purposes; ask your health care provider or pharmacist if you have questions. What should I tell my health care provider before I take this medicine? They need to know if you have any of these conditions: -agitation -diarrhea -depression or other mental illness -diabetes -glaucoma -heart disease -high or low blood pressure -history of anorexia or other eating disorder -history of substance abuse -kidney stones or kidney disease -liver disease -lung disease like asthma, obstructive pulmonary disease, emphysema -metabolic acidosis -on a ketogenic diet -scheduled for surgery or a procedure -suicidal thoughts, plans, or attempt; a previous suicide attempt by you or a family member -taken an MAOI like Carbex, Eldepryl, Marplan, Nardil, or Parnate in last 14 days -thyroid disease -an unusual or allergic reaction to phentermine, topiramate, other medicines, foods, dyes, or preservatives -pregnant or trying to get pregnant -breast-feeding How should I use this medicine? Take this medicine by mouth with a glass of water. Follow the directions on the prescription label. Do  not crush or chew. This medicine is usually taken with or without food once per day in the morning. Avoid taking this medicine in the evening. It may interfere with sleep. Take your doses at regular intervals. Do not take your medicine more often than directed. A special MedGuide will be given to you by the pharmacist with each prescription and refill. Be sure to read this information carefully each time. Talk to your pediatrician regarding the use of this medicine in children. Special care may be needed. Overdosage: If you think you've taken too much of this medicine contact a poison control center or emergency room at once. Overdosage: If you think you have taken too much of this medicine contact a poison control center or emergency room at once. NOTE: This medicine is only for you. Do not share this medicine with others. What if I miss a dose? If you miss a dose, take it as soon as you can. If it is almost time for your next dose, take only that dose. Do not take double or extra doses. What may interact with this medicine? Do not take this medicine with any of the following medications: -MAOIs like Carbex, Eldepryl, Marplan, Nardil, and ParnateThis medicine may also interact with the following medications: -acetazolamide -amitriptyline -antihistamines for allergy, cough and cold -atropine -birth control pills -carbamazepine -certain medicines for bladder problems like oxybutynin, tolterodine -certain medicines for depression, anxiety, or psychotic disturbances -certain medicines for Parkinson's disease like benztropine, trihexyphenidyl -certain medicines for stomach problems like dicyclomine, hyoscyamine -certain medicines for travel sickness like scopolamine -dichlorphenamide -digoxin -diltiazem -diuretics -hydrochlorothiazide -ipratropium -lithium -medicines for diabetes -medicines for pain, sleep, or muscle relaxation -methazolamide -phenytoin -pioglitazone -stimulant  medicines for attention disorders, weight loss, or to  stay awake -valproic acid -zonisamide This list may not describe all possible interactions. Give your health care provider a list of all the medicines, herbs, non-prescription drugs, or dietary supplements you use. Also tell them if you smoke, drink alcohol, or use illegal drugs. Some items may interact with your medicine. What should I watch for while using this medicine? Visit your doctor or health care professional for regular checks on your progress. This medicine is intended to be used in addition to a healthy diet and appropriate exercise. The best results are achieved this way. Do not increase or in any way change your dose without consulting your doctor or health care professional. Do not take this medicine within 6 hours of bedtime. It can keep you from getting to sleep. Avoid drinks that contain caffeine and try to stick to a regular bedtime every night. Do not stop taking this medicine suddenly. This increases the risk of seizures. This medicine can decrease sweating and increase your body temperature. Watch for signs of deceased sweating or fever. Avoid extreme heat, hot baths, and saunas. Be careful about exercising, especially in hot weather. Contact your health care provider right away if you notice a fever or decrease in sweating. You should drink plenty of fluids while taking this medicine. If you have had kidney stones in the past, this will help to reduce your chances of forming kidney stones. If you have stomach pain, with nausea or vomiting and yellowing of your eyes or skin, call your doctor immediately. You may get drowsy or dizzy. Do not drive, use machinery, or do anything that needs mental alertness until you know how this medicine affects you. Do not stand or sit up quickly, especially if you are an older patient. This reduces the risk of dizzy or fainting spells. Alcohol may increase dizziness and drowsiness. Avoid alcoholic  drinks. This medicine may affect blood sugar levels. If you have diabetes, check with your doctor or health care professional before you change your diet or the dose of your diabetic medicine. Patients and their families should watch out for worsening depression or thoughts of suicide. Also watch out for sudden changes in feelings such as feeling anxious, agitated, panicky, irritable, hostile, aggressive, impulsive, severely restless, overly excited and hyperactive, or not being able to sleep. If this happens, especially at the beginning of treatment or after a change in dose, call your health care professional. If you notice blurred vision, eye pain, or other eye problems, seek medical attention at once for an eye exam. This medicine may increase the chance of developing metabolic acidosis. If left untreated, this can cause kidney stones, bone disease, or slowed growth in children. Symptoms include breathing fast, fatigue, loss of appetite, irregular heartbeat, or loss of consciousness. Call your doctor immediately if you experience any of these side effects. Also, tell your doctor about any surgery you plan on having while taking this medicine since this may increase your risk for metabolic acidosis. Women who become pregnant while using this medicine should contact their physician immediately. You should also contact The Qsymia Pregnancy Surveillance Program which is a program that monitors pregnancies that occur during treatment. Contact the program by calling [phone]. What side effects may I notice from receiving this medicine? Side effects that you should report to your doctor or health care professional as soon as possible: -allergic reactions like skin rash, itching or hives, swelling of the face, lips, or tongue -blood in the urine -changes in vision -chest pain or chest  tightness -confusion -depressed mood -difficulty breathing -dizziness -fast or irregular heartbeat -feeling  anxious -irritable -loss of appetite -low blood pressure -pain in the lower back or side -pain, tingling, numbness in the hands or feet -pain when urinating -palpitations -redness, blistering, peeling or loosening of the skin, including inside the mouth -shortness of breath -suicidal thoughts or other mood changes -trouble passing urine or change in the amount of urine -trouble walking, dizziness, loss of balance or coordination -unusually weak or tired -vomiting  Side effects that usually do not require medical attention (Report these to your doctor or health care professional if they continue or are bothersome.): -change in sex drive or performance -changes in vision -constipation -diarrhea -dry mouth -headache -nausea -tremors -trouble sleeping -upset stomach This list may not describe all possible side effects. Call your doctor for medical advice about side effects. You may report side effects to FDA at 1-800-FDA-1088. Where should I keep my medicine? Keep out of the reach of children. This medicine can be abused. Keep your medicine in a safe place to protect it from theft. Do not share this medicine with anyone. Selling or giving away this medicine is dangerous and against the law. Store at room temperature between 15 and 25 degrees C (59 and 77 degrees F). Throw away any unused medicine after the expiration date. NOTE: This sheet is a summary. It may not cover all possible information. If you have questions about this medicine, talk to your doctor, pharmacist, or health care provider.  2013, [employer]. (08/03/2010 1:56:53 PM)

## 2012-06-01 ENCOUNTER — Other Ambulatory Visit: Payer: Self-pay

## 2012-06-01 DIAGNOSIS — Z1231 Encounter for screening mammogram for malignant neoplasm of breast: Secondary | ICD-10-CM

## 2012-06-03 ENCOUNTER — Other Ambulatory Visit: Payer: Self-pay | Admitting: Family Medicine

## 2012-06-12 ENCOUNTER — Encounter: Payer: Self-pay | Admitting: Family Medicine

## 2012-06-12 ENCOUNTER — Ambulatory Visit
Admission: RE | Admit: 2012-06-12 | Discharge: 2012-06-12 | Disposition: A | Discharge status: Home / Self Care | Payer: 59 | Source: Ambulatory Visit

## 2012-06-12 DIAGNOSIS — Z1231 Encounter for screening mammogram for malignant neoplasm of breast: Secondary | ICD-10-CM

## 2012-08-15 ENCOUNTER — Encounter: Payer: Self-pay | Admitting: Medical

## 2012-08-15 ENCOUNTER — Ambulatory Visit (INDEPENDENT_AMBULATORY_CARE_PROVIDER_SITE_OTHER): Payer: 59 | Admitting: Medical

## 2012-08-15 VITALS — BP 122/86 | HR 88 | Wt 366.0 lb

## 2012-08-15 DIAGNOSIS — M62830 Muscle spasm of back: Secondary | ICD-10-CM

## 2012-08-15 DIAGNOSIS — M62838 Other muscle spasm: Secondary | ICD-10-CM

## 2012-08-15 DIAGNOSIS — M5380 Other specified dorsopathies, site unspecified: Secondary | ICD-10-CM

## 2012-08-15 MED ORDER — METHOCARBAMOL 500 MG PO TABS: 500.0000 mg | ORAL_TABLET | Freq: Four times a day (QID) | ORAL | Status: DC

## 2012-08-15 NOTE — Patient Instructions (Signed)
Use gentle neck and shoulder and upper back stretching and range of motion exercise  Use heat to the area  Begin Aleve twice daily OTC for the next 4-5 days.  Begin muscle relaxer Robaxin either at bedtime, or up to 3 times daily for spasm.  Go get a massage.   If not improving, call or [employer].   Torticollis, Acute You have suddenly (acutely) developed a twisted neck (torticollis). This is usually a self-limited condition. CAUSES  Acute torticollis may be caused by malposition, trauma or infection. Most commonly, acute torticollis is caused by sleeping in an awkward position. Torticollis may also be caused by the flexion, extension or twisting of the neck muscles beyond their normal position. Sometimes, the exact cause may not be known. SYMPTOMS  Usually, there is pain and limited movement of the neck. Your neck may twist to one side. DIAGNOSIS  The diagnosis is often made by physical examination. X-rays, CT scans or MRIs may be done if there is a history of trauma or concern of infection. TREATMENT  For a common, stiff neck that develops during sleep, treatment is focused on relaxing the contracted neck muscle. Medications (including shots) may be used to treat the problem. Most cases resolve in several days. Torticollis usually responds to conservative physical therapy. If left untreated, the shortened and spastic neck muscle can cause deformities in the face and neck. Rarely, surgery is required. HOME CARE INSTRUCTIONS   Use over-the-counter and prescription medications as directed by your caregiver.  Do stretching exercises and massage the neck as directed by your caregiver.  Follow up with physical therapy if needed and as directed by your caregiver. SEEK IMMEDIATE MEDICAL CARE IF:   You develop difficulty breathing or noisy breathing (stridor).  You drool, develop trouble swallowing or have pain with swallowing.  You develop numbness or weakness in the hands or feet.  You  have changes in speech or vision.  You have problems with urination or bowel movements.  You have difficulty walking.  You have a fever.  You have increased pain. MAKE SURE YOU:   Understand these instructions.  Will watch your condition.  Will get help right away if you are not doing well or get worse. Document Released: 12/26/1999 Document Revised: 03/22/2011 Document Reviewed: 02/05/2009 [facility] Patient Information 2014 [place], [place].

## 2012-08-15 NOTE — Progress Notes (Signed)
Subjective: Here for neck and shoulder pain x 3 days, progressively getting worse.   Thinks she has a muscle knot in the left upper back and neck.  Pain radiates to shoulder, makes her head hurt.  Can't turn neck to left without pain.  Denies injury, trauma.   Pain is constant achy pain.  Left arm motion doesn't seem to aggravate things.  Denies any change in activity over the weekend.  Wonders if she slept on it wrong.  Using heat and cold topically.  Husband tried massaging it.  Used some Tylenol. No other aggravating or relieving factors.    ROS as in subjective   Objective:   Physical Exam  Filed Vitals:   08/15/12 1608  BP: 122/86  Pulse: 88    General appearance: alert, no distress, WD/WN Neck: nontender, but left neck rotation and flexion limited due to pain, otherwise neck ROM normal, no lymphadenopathy, no thyromegaly, no masses Back: tender left upper back paraspinal, but otherwise nontender, normal back ROM Musculoskeletal: shoulders nontender, no swelling, no obvious deformity, normal ROM Extremities: no edema Pulses: 2+ symmetric, upper extremities Neuro: UE and neck intact   Assessment and Plan :    Encounter Diagnoses  Name Primary?  . Neck muscle spasm Yes  . Back spasm    Discussed diagnosis, supportive care, usual time frame to resolve.   Begin Robaxin, heat, massage, ROM and gentle stretching exercise, OTC Aleve.  Follow-up prn.

## 2012-11-04 ENCOUNTER — Emergency Department (HOSPITAL_BASED_OUTPATIENT_CLINIC_OR_DEPARTMENT_OTHER)
Admission: EM | Admit: 2012-11-04 | Discharge: 2012-11-05 | Disposition: A | Discharge status: Home / Self Care | Payer: 59 | Attending: Emergency Medicine | Admitting: Emergency Medicine

## 2012-11-04 ENCOUNTER — Emergency Department (HOSPITAL_BASED_OUTPATIENT_CLINIC_OR_DEPARTMENT_OTHER): Payer: 59

## 2012-11-04 ENCOUNTER — Encounter (HOSPITAL_BASED_OUTPATIENT_CLINIC_OR_DEPARTMENT_OTHER): Payer: Self-pay | Admitting: Emergency Medicine

## 2012-11-04 DIAGNOSIS — Z792 Long term (current) use of antibiotics: Secondary | ICD-10-CM | POA: Insufficient documentation

## 2012-11-04 DIAGNOSIS — R6883 Chills (without fever): Secondary | ICD-10-CM | POA: Insufficient documentation

## 2012-11-04 DIAGNOSIS — K5732 Diverticulitis of large intestine without perforation or abscess without bleeding: Secondary | ICD-10-CM | POA: Insufficient documentation

## 2012-11-04 DIAGNOSIS — Z79899 Other long term (current) drug therapy: Secondary | ICD-10-CM | POA: Insufficient documentation

## 2012-11-04 DIAGNOSIS — I10 Essential (primary) hypertension: Secondary | ICD-10-CM | POA: Insufficient documentation

## 2012-11-04 DIAGNOSIS — Z8742 Personal history of other diseases of the female genital tract: Secondary | ICD-10-CM | POA: Insufficient documentation

## 2012-11-04 DIAGNOSIS — M545 Low back pain, unspecified: Secondary | ICD-10-CM | POA: Insufficient documentation

## 2012-11-04 DIAGNOSIS — K5792 Diverticulitis of intestine, part unspecified, without perforation or abscess without bleeding: Secondary | ICD-10-CM

## 2012-11-04 DIAGNOSIS — E119 Type 2 diabetes mellitus without complications: Secondary | ICD-10-CM | POA: Insufficient documentation

## 2012-11-04 LAB — CBC WITH DIFFERENTIAL/PLATELET
Basophils Absolute: 0 10*3/uL (ref 0.0–0.1)
Basophils Relative: 0 % (ref 0–1)
Eosinophils Absolute: 0.1 K/uL (ref 0.0–0.7)
Eosinophils Relative: 1 % (ref 0–5)
HCT: 37.6 % (ref 36.0–46.0)
Hemoglobin: 12.4 g/dL (ref 12.0–15.0)
Lymphocytes Relative: 29 % (ref 12–46)
Lymphs Abs: 3.6 K/uL (ref 0.7–4.0)
MCH: 28.1 pg (ref 26.0–34.0)
MCHC: 33 g/dL (ref 30.0–36.0)
MCV: 85.3 fL (ref 78.0–100.0)
Monocytes Absolute: 1 K/uL (ref 0.1–1.0)
Monocytes Relative: 8 % (ref 3–12)
Neutro Abs: 7.5 10*3/uL (ref 1.7–7.7)
Neutrophils Relative %: 61 % (ref 43–77)
Platelets: 377 10*3/uL (ref 150–400)
RBC: 4.41 MIL/uL (ref 3.87–5.11)
RDW: 13.2 % (ref 11.5–15.5)
WBC: 12.3 10*3/uL — ABNORMAL HIGH (ref 4.0–10.5)

## 2012-11-04 LAB — URINALYSIS, ROUTINE W REFLEX MICROSCOPIC
Bilirubin Urine: NEGATIVE
Glucose, UA: NEGATIVE mg/dL
Hgb urine dipstick: NEGATIVE
Ketones, ur: NEGATIVE mg/dL
Leukocytes, UA: NEGATIVE
Nitrite: NEGATIVE
Protein, ur: NEGATIVE mg/dL
Specific Gravity, Urine: 1.026 (ref 1.005–1.030)
Urobilinogen, UA: 1 mg/dL (ref 0.0–1.0)
pH: 8 (ref 5.0–8.0)

## 2012-11-04 LAB — COMPREHENSIVE METABOLIC PANEL WITH GFR
Albumin: 3.6 g/dL (ref 3.5–5.2)
Alkaline Phosphatase: 77 U/L (ref 39–117)
BUN: 12 mg/dL (ref 6–23)
Chloride: 99 meq/L (ref 96–112)
Creatinine, Ser: 0.8 mg/dL (ref 0.50–1.10)
GFR calc Af Amer: >90 mL/min (ref >90)
Glucose, Bld: 104 mg/dL — ABNORMAL HIGH (ref 70–99)
Total Bilirubin: 0.3 mg/dL (ref 0.3–1.2)
Total Protein: 8.1 g/dL (ref 6.0–8.3)

## 2012-11-04 LAB — COMPREHENSIVE METABOLIC PANEL
ALT: 12 U/L (ref 0–35)
AST: 12 U/L (ref 0–37)
CO2: 28 mEq/L (ref 19–32)
Calcium: 10 mg/dL (ref 8.4–10.5)
GFR calc non Af Amer: 87 mL/min — ABNORMAL LOW (ref >90)
Potassium: 3.4 mEq/L — ABNORMAL LOW (ref 3.5–5.1)
Sodium: 138 mEq/L (ref 135–145)

## 2012-11-04 LAB — LIPASE, BLOOD: Lipase: 15 U/L (ref 11–59)

## 2012-11-04 MED ORDER — IOHEXOL 300 MG/ML  SOLN
50.0000 mL | Freq: Once | INTRAMUSCULAR | Status: AC | PRN
  Administered 2012-11-04: 50 mL via ORAL

## 2012-11-04 MED ORDER — CIPROFLOXACIN HCL 500 MG PO TABS: 500.0000 mg | ORAL_TABLET | Freq: Two times a day (BID) | ORAL | Status: AC

## 2012-11-04 MED ORDER — SODIUM CHLORIDE 0.9 % IV SOLN
Freq: Once | INTRAVENOUS | Status: AC
  Administered 2012-11-04: 20:00:00 via INTRAVENOUS

## 2012-11-04 MED ORDER — METRONIDAZOLE 500 MG PO TABS: 500.0000 mg | ORAL_TABLET | Freq: Two times a day (BID) | ORAL | Status: AC

## 2012-11-04 MED ORDER — CIPROFLOXACIN IN D5W 400 MG/200ML IV SOLN
400.0000 mg | Freq: Once | INTRAVENOUS | Status: AC
  Administered 2012-11-04: 400 mg via INTRAVENOUS
  Filled 2012-11-04: qty 200

## 2012-11-04 MED ORDER — ONDANSETRON HCL 4 MG/2ML IJ SOLN
4.0000 mg | Freq: Four times a day (QID) | INTRAMUSCULAR | Status: DC | PRN
  Administered 2012-11-04 (×2): 4 mg via INTRAVENOUS
  Filled 2012-11-04 (×2): qty 2

## 2012-11-04 MED ORDER — HYDROCODONE-ACETAMINOPHEN 5-325 MG PO TABS: ORAL_TABLET | ORAL | Status: AC

## 2012-11-04 MED ORDER — IOHEXOL 300 MG/ML  SOLN
100.0000 mL | Freq: Once | INTRAMUSCULAR | Status: AC | PRN
  Administered 2012-11-04: 100 mL via INTRAVENOUS

## 2012-11-04 MED ORDER — HYDROMORPHONE HCL PF 1 MG/ML IJ SOLN
1.0000 mg | Freq: Once | INTRAMUSCULAR | Status: AC
  Administered 2012-11-04: 1 mg via INTRAVENOUS
  Filled 2012-11-04: qty 1

## 2012-11-04 MED ORDER — PROMETHAZINE HCL 25 MG PO TABS: 25.0000 mg | ORAL_TABLET | Freq: Four times a day (QID) | ORAL | Status: AC | PRN

## 2012-11-04 MED ORDER — METRONIDAZOLE IN NACL 5-0.79 MG/ML-% IV SOLN
500.0000 mg | Freq: Once | INTRAVENOUS | Status: AC
  Administered 2012-11-04: 500 mg via INTRAVENOUS
  Filled 2012-11-04: qty 100

## 2012-11-04 NOTE — ED Provider Notes (Signed)
CSN: [id number]     Arrival date & time 11/04/12  1753 History  This chart was scribed for Courtney Bolton. Oletta Lamas, MD by Danella Maiers, ED Scribe. This patient was seen in room MH02/MH02 and the patient's care was started at 6:48 PM.   Chief Complaint  Patient presents with  . Abdominal Pain   The history is provided by the patient. No language interpreter was used.   HPI Comments: Courtney Bolton is a 47 y.o. female with a h/o hypertension, obesity, DM, and peripheral edema who presents to the Emergency Department complaining of constant lower back pain onset two weeks ago and constant, sharplower abdominal pain onset one week ago. Nothing makes either pain better or worse. She also reports mild chills. She rates her pain as an 8/10 severity. She has been taking Aleve for the pain. Pt suspects she has a UTI because of prior history of the same. She denies injuries or falls. She had a urinalysis yesterday that was negative and she was not given any medication. She denies dysuria, frequency, any other vaginal or urinary symptoms, constipation, diarrhea, nausea, vomiting, fevers. She denies pain with intercourse. She is married with two kids.  Past Medical History  Diagnosis Date  . Hypertension   . Morbid obesity   . Peripheral edema   . Fibrocystic breast disease   . Diabetes mellitus     Diabetes type II dx 01/2011   Past Surgical History  Procedure Laterality Date  . Abdominal hysterectomy  2002    Dr. Audie Box; fibroids   Family History  Problem Relation Age of Onset  . Diabetes Mother   . Hypertension Mother   . Diabetes Sister   . Hypertension Sister   . Cancer Maternal Grandmother     luekemia  . Heart disease Maternal Grandfather   . Stroke Neg Hx    History  Substance Use Topics  . Smoking status: Never Smoker   . Smokeless tobacco: Not on file  . Alcohol Use: No   OB History   Grav Para Term Preterm Abortions TAB SAB Ect Mult Living                 Review of  Systems  Constitutional: Positive for chills. Negative for fever.  Gastrointestinal: Positive for abdominal pain. Negative for nausea, vomiting and constipation.  Genitourinary: Negative for dysuria, urgency, frequency, hematuria, flank pain, decreased urine volume, vaginal bleeding, vaginal discharge, enuresis, difficulty urinating, genital sores, vaginal pain, menstrual problem, pelvic pain and dyspareunia.  Musculoskeletal: Positive for back pain.  All other systems reviewed and are negative.    Allergies  Lisinopril and Sulfa antibiotics  Home Medications   Current Outpatient Rx  Name  Route  Sig  Dispense  Refill  . ciprofloxacin (CIPRO) 500 MG tablet   Oral   Take 1 tablet (500 mg total) by mouth 2 (two) times daily.   20 tablet   0   . hydrochlorothiazide (HYDRODIURIL) 25 MG tablet      TAKE 1 TABLET (25 MG TOTAL) BY MOUTH DAILY.   90 tablet   3   . hydrochlorothiazide (HYDRODIURIL) 25 MG tablet      TAKE 1 TABLET (25 MG TOTAL) BY MOUTH DAILY.   30 tablet   11   . HYDROcodone-acetaminophen (NORCO/VICODIN) 5-325 MG per tablet      1-2 tablets po q 6 hours prn moderate to severe pain   20 tablet   0   . metFORMIN (GLUCOPHAGE) 500 MG tablet  Oral   Take 1 tablet (500 mg total) by mouth 2 (two) times daily with a meal.   180 tablet   3   . metroNIDAZOLE (FLAGYL) 500 MG tablet   Oral   Take 1 tablet (500 mg total) by mouth 2 (two) times daily.   20 tablet   0   . promethazine (PHENERGAN) 25 MG tablet   Oral   Take 1 tablet (25 mg total) by mouth every 6 (six) hours as needed for nausea.   20 tablet   0    BP 149/86  Pulse 99  Temp(Src) 99.1 F (37.3 C) (Oral)  Resp 18  SpO2 98% Physical Exam  Nursing note and vitals reviewed. Constitutional: She is oriented to person, place, and time. She appears well-developed and well-nourished. No distress.  HENT:  Head: Normocephalic and atraumatic.  Eyes: EOM are normal.  Neck: Neck supple. No tracheal  deviation present.  Cardiovascular: Normal rate and regular rhythm.   Pulmonary/Chest: Effort normal and breath sounds normal. No respiratory distress.  Abdominal: Bowel sounds are normal. There is tenderness (mild LLQ). There is no rebound.  No CVA tenderness.  Musculoskeletal: Normal range of motion.  Neurological: She is alert and oriented to person, place, and time.  Skin: Skin is warm and dry.  Psychiatric: She has a normal mood and affect. Her behavior is normal.    ED Course  Procedures (including critical care time) Medications  ondansetron (ZOFRAN) injection 4 mg (4 mg Intravenous Given 11/04/12 2123)  ciprofloxacin (CIPRO) IVPB 400 mg (not administered)  metroNIDAZOLE (FLAGYL) IVPB 500 mg (not administered)  0.9 %  sodium chloride infusion ( Intravenous New Bag/Given 11/04/12 1958)  HYDROmorphone (DILAUDID) injection 1 mg (1 mg Intravenous Given 11/04/12 1958)  iohexol (OMNIPAQUE) 300 MG/ML solution 50 mL (50 mLs Oral Contrast Given 11/04/12 1931)  iohexol (OMNIPAQUE) 300 MG/ML solution 100 mL (100 mLs Intravenous Contrast Given 11/04/12 2145)    DIAGNOSTIC STUDIES: Oxygen Saturation is 98% on RA, normal by my interpretation.    COORDINATION OF CARE: 7:11 PM- Discussed treatment plan with pt which includes CT abdomen. Pt agrees to plan.    Labs Review Labs Reviewed  URINALYSIS, ROUTINE W REFLEX MICROSCOPIC - Abnormal; Notable for the following:    APPearance CLOUDY (*)    All other components within normal limits  CBC WITH DIFFERENTIAL - Abnormal; Notable for the following:    WBC 12.3 (*)    All other components within normal limits  COMPREHENSIVE METABOLIC PANEL - Abnormal; Notable for the following:    Potassium 3.4 (*)    Glucose, Bld 104 (*)    GFR calc non Af Amer 87 (*)    All other components within normal limits  LIPASE, BLOOD   Imaging Review Ct Abdomen Pelvis W Contrast  11/04/2012   CLINICAL DATA:  Abdominal pain. Left-sided back pain.  EXAM: CT  ABDOMEN AND PELVIS WITH CONTRAST  TECHNIQUE: Multidetector CT imaging of the abdomen and pelvis was performed using the standard protocol following bolus administration of intravenous contrast.  CONTRAST:  100 mL Omnipaque 300.  COMPARISON:  None.  FINDINGS: Lung Bases: Dependent atelectasis.  Liver: Low attenuation of the liver compatible with hepatosteatosis scars the probable hepatosteatosis. No focal mass lesions.  Spleen:  Normal.  Gallbladder:  Normal. No calcified stones or inflammatory changes.  Common bile duct:  Normal.  Pancreas:  Normal.  Adrenal glands:  Normal bilaterally.  Kidneys: Normal enhancement and delayed excretion of contrast. The ureters appear within  normal limits.  Stomach:  Normal.  Small bowel: Duodenum appears normal. No small bowel obstruction or inflammatory changes. No mesenteric adenopathy.  Colon: Normal appendix. Descending colonic diverticulosis. In the proximal sigmoid, there is mural thickening of the colon and pericolonic fat stranding consistent with early acute diverticulitis.  Pelvic Genitourinary: Normal urinary bladder. Hysterectomy. No free fluid.  Bones:  No aggressive osseous lesions. Thoracolumbar spondylosis.  Vasculature: Retroaortic left renal vein incidentally noted.  Body Wall: Fat containing periumbilical hernia.  IMPRESSION: Mild mural thickening of the proximal sigmoid colon with mild fat stranding compatible with early acute diverticulitis. No complication.   Electronically Signed   By: Andreas Newport M.D.   On: 11/04/2012 21:58    EKG Interpretation   None      10:11 PM CT abd shows diverticulitis, early per radiologist.  No perf.  Pt will get IV dose of abx, then Rx for pain, nausea, abx and instructions to follow up with PCP.  Return if pain suddenly worsens, gets severe.     MDM   1. Diverticulitis    I personally performed the services described in this documentation, which was scribed in my presence. The recorded information has been  reviewed and considered.    Pt with left side lower abd pain, had low back pain fora  Few days.  Low grade temp, WBC is up.  S/p hysterectomy, no GYN symptoms.  Will get CT to assess for pyelo, stone, diverticulitis.       Courtney Bolton. Oletta Lamas, MD 11/04/12 2211

## 2012-11-04 NOTE — Discharge Instructions (Signed)
Diverticulitis °A diverticulum is a small pouch or sac on the colon. Diverticulosis is the presence of these diverticula on the colon. Diverticulitis is the irritation (inflammation) or infection of diverticula. °CAUSES  °The colon and its diverticula contain bacteria. If food particles block the tiny opening to a diverticulum, the bacteria inside can grow and cause an increase in pressure. This leads to infection and inflammation and is called diverticulitis. °SYMPTOMS  °· Abdominal pain and tenderness. Usually, the pain is located on the left side of your abdomen. However, it could be located elsewhere. °· Fever. °· Bloating. °· Feeling sick to your stomach (nausea). °· Throwing up (vomiting). °· Abnormal stools. °DIAGNOSIS  °Your caregiver will take a history and perform a physical exam. Since many things can cause abdominal pain, other tests may be necessary. Tests may include: °· Blood tests. °· Urine tests. °· X-ray of the abdomen. °· CT scan of the abdomen. °Sometimes, surgery is needed to determine if diverticulitis or other conditions are causing your symptoms. °TREATMENT  °Most of the time, you can be treated without surgery. Treatment includes: °· Resting the bowels by only having liquids for a few days. As you improve, you will need to eat a low-fiber diet. °· Intravenous (IV) fluids if you are losing body fluids (dehydrated). °· Antibiotic medicines that treat infections may be given. °· Pain and nausea medicine, if needed. °· Surgery if the inflamed diverticulum has burst. °HOME CARE INSTRUCTIONS  °· Try a clear liquid diet (broth, tea, or water for as long as directed by your caregiver). You may then gradually begin a low-fiber diet as tolerated.  °A low-fiber diet is a diet with less than 10 grams of fiber. Choose the foods below to reduce fiber in the diet: °· White breads, cereals, rice, and pasta. °· Cooked fruits and vegetables or soft fresh fruits and vegetables without the skin. °· Ground or  well-cooked tender beef, ham, veal, lamb, pork, or poultry. °· Eggs and seafood. °· After your diverticulitis symptoms have improved, your caregiver may put you on a high-fiber diet. A high-fiber diet includes 14 grams of fiber for every 1000 calories consumed. For a standard 2000 calorie diet, you would need 28 grams of fiber. Follow these diet guidelines to help you increase the fiber in your diet. It is important to slowly increase the amount fiber in your diet to avoid gas, constipation, and bloating. °· Choose whole-grain breads, cereals, pasta, and brown rice. °· Choose fresh fruits and vegetables with the skin on. Do not overcook vegetables because the more vegetables are cooked, the more fiber is lost. °· Choose more nuts, seeds, legumes, dried peas, beans, and lentils. °· Look for food products that have greater than 3 grams of fiber per serving on the Nutrition Facts label. °· Take all medicine as directed by your caregiver. °· If your caregiver has given you a follow-up appointment, it is very important that you go. Not going could result in lasting (chronic) or permanent injury, pain, and disability. If there is any problem keeping the appointment, call to reschedule. °SEEK MEDICAL CARE IF:  °· Your pain does not improve. °· You have a hard time advancing your diet beyond clear liquids. °· Your bowel movements do not return to normal. °SEEK IMMEDIATE MEDICAL CARE IF:  °· Your pain becomes worse. °· You have an oral temperature above 102° F (38.9° C), not controlled by medicine. °· You have repeated vomiting. °· You have bloody or black, tarry stools. °·   Symptoms that brought you to your caregiver become worse or are not getting better. MAKE SURE YOU:   Understand these instructions.  Will watch your condition.  Will get help right away if you are not doing well or get worse. Document Released: 10/07/2004 Document Revised: 03/22/2011 Document Reviewed: 02/02/2010 [facility] Patient Information  2014 [employer] [place].    Narcotic and benzodiazepine use may cause drowsiness, slowed breathing or dependence.  Please use with caution and do not drive, operate machinery or watch young children alone while taking them.  Taking combinations of these medications or drinking alcohol will potentiate these effects.

## 2012-11-04 NOTE — ED Notes (Signed)
Patient here with lower backpain x 2 weeks and lower abdominal pain x 1 week. Patient states that she thinks it may be uti, hx of same with these symptoms

## 2012-11-05 NOTE — ED Notes (Signed)
No adverse effects noted to IV antibiotics.

## 2013-06-28 ENCOUNTER — Other Ambulatory Visit: Payer: Self-pay

## 2013-06-28 DIAGNOSIS — Z1231 Encounter for screening mammogram for malignant neoplasm of breast: Secondary | ICD-10-CM

## 2013-07-18 ENCOUNTER — Ambulatory Visit
Admission: RE | Admit: 2013-07-18 | Discharge: 2013-07-18 | Disposition: A | Discharge status: Home / Self Care | Payer: 59 | Source: Ambulatory Visit

## 2013-07-18 ENCOUNTER — Other Ambulatory Visit: Payer: Self-pay

## 2013-07-18 DIAGNOSIS — Z1231 Encounter for screening mammogram for malignant neoplasm of breast: Secondary | ICD-10-CM

## 2013-07-24 ENCOUNTER — Ambulatory Visit: Payer: Self-pay | Admitting: Medical

## 2013-07-30 ENCOUNTER — Ambulatory Visit: Payer: Self-pay | Admitting: Medical

## 2013-08-07 ENCOUNTER — Ambulatory Visit: Payer: 59 | Admitting: Medical

## 2013-08-15 ENCOUNTER — Encounter: Payer: Self-pay | Admitting: Medical

## 2014-03-21 ENCOUNTER — Encounter: Payer: Self-pay | Admitting: Family Medicine

## 2014-03-21 ENCOUNTER — Ambulatory Visit: Payer: 59 | Admitting: Family Medicine

## 2014-04-16 ENCOUNTER — Telehealth: Payer: Self-pay | Admitting: Internal Medicine

## 2014-04-16 NOTE — Telephone Encounter (Signed)
[facility] @ [phone] on March 18th, 2016

## 2014-09-30 ENCOUNTER — Other Ambulatory Visit: Payer: Self-pay

## 2014-09-30 DIAGNOSIS — Z1231 Encounter for screening mammogram for malignant neoplasm of breast: Secondary | ICD-10-CM

## 2014-10-08 ENCOUNTER — Ambulatory Visit
Admission: RE | Admit: 2014-10-08 | Discharge: 2014-10-08 | Disposition: A | Discharge status: Home / Self Care | Payer: PRIVATE HEALTH INSURANCE | Source: Ambulatory Visit

## 2014-10-08 DIAGNOSIS — Z1231 Encounter for screening mammogram for malignant neoplasm of breast: Secondary | ICD-10-CM

## 2015-11-05 ENCOUNTER — Other Ambulatory Visit: Payer: Self-pay | Admitting: Family Medicine

## 2015-11-05 DIAGNOSIS — Z1231 Encounter for screening mammogram for malignant neoplasm of breast: Secondary | ICD-10-CM

## 2015-11-27 ENCOUNTER — Ambulatory Visit
Admission: RE | Admit: 2015-11-27 | Discharge: 2015-11-27 | Disposition: A | Discharge status: Home / Self Care | Payer: PRIVATE HEALTH INSURANCE | Source: Ambulatory Visit | Attending: Family Medicine | Admitting: Family Medicine

## 2015-11-27 DIAGNOSIS — Z1231 Encounter for screening mammogram for malignant neoplasm of breast: Secondary | ICD-10-CM

## 2018-08-10 ENCOUNTER — Other Ambulatory Visit: Payer: Self-pay | Admitting: Family Medicine

## 2019-02-20 ENCOUNTER — Other Ambulatory Visit: Payer: Self-pay | Admitting: Family Medicine

## 2019-02-20 DIAGNOSIS — Z1231 Encounter for screening mammogram for malignant neoplasm of breast: Secondary | ICD-10-CM

## 2019-04-03 ENCOUNTER — Ambulatory Visit: Payer: PRIVATE HEALTH INSURANCE

## 2019-04-13 ENCOUNTER — Ambulatory Visit
Admission: RE | Admit: 2019-04-13 | Discharge: 2019-04-13 | Disposition: A | Discharge status: Home / Self Care | Payer: PRIVATE HEALTH INSURANCE | Source: Ambulatory Visit | Attending: Family Medicine | Admitting: Family Medicine

## 2019-04-13 ENCOUNTER — Other Ambulatory Visit: Payer: Self-pay

## 2019-04-13 DIAGNOSIS — Z1231 Encounter for screening mammogram for malignant neoplasm of breast: Secondary | ICD-10-CM

## 2020-04-30 ENCOUNTER — Other Ambulatory Visit: Payer: Self-pay | Admitting: Family Medicine

## 2020-04-30 DIAGNOSIS — Z1231 Encounter for screening mammogram for malignant neoplasm of breast: Secondary | ICD-10-CM

## 2020-06-18 ENCOUNTER — Other Ambulatory Visit: Payer: Self-pay

## 2020-06-18 ENCOUNTER — Ambulatory Visit
Admission: RE | Admit: 2020-06-18 | Discharge: 2020-06-18 | Disposition: A | Discharge status: Home / Self Care | Payer: BC Managed Care – PPO | Source: Ambulatory Visit | Attending: Family Medicine | Admitting: Family Medicine

## 2020-06-18 DIAGNOSIS — Z1231 Encounter for screening mammogram for malignant neoplasm of breast: Secondary | ICD-10-CM

## 2021-02-13 ENCOUNTER — Other Ambulatory Visit (HOSPITAL_BASED_OUTPATIENT_CLINIC_OR_DEPARTMENT_OTHER): Payer: Self-pay

## 2021-02-13 MED ORDER — TRULICITY 3 MG/0.5ML ~~LOC~~ SOAJ
SUBCUTANEOUS | 0 refills | Status: DC
  Filled 2021-02-13: qty 2, 28d supply, fill #0

## 2021-03-10 ENCOUNTER — Other Ambulatory Visit (HOSPITAL_BASED_OUTPATIENT_CLINIC_OR_DEPARTMENT_OTHER): Payer: Self-pay

## 2021-03-10 MED ORDER — TRULICITY 3 MG/0.5ML ~~LOC~~ SOAJ
SUBCUTANEOUS | 1 refills | Status: AC
  Filled 2021-03-10: qty 2, 28d supply, fill #0
  Filled 2021-04-20: qty 2, 28d supply, fill #1
  Filled 2021-05-27: qty 2, 28d supply, fill #2

## 2021-04-20 ENCOUNTER — Other Ambulatory Visit (HOSPITAL_BASED_OUTPATIENT_CLINIC_OR_DEPARTMENT_OTHER): Payer: Self-pay

## 2021-05-27 ENCOUNTER — Other Ambulatory Visit (HOSPITAL_BASED_OUTPATIENT_CLINIC_OR_DEPARTMENT_OTHER): Payer: Self-pay

## 2021-06-16 ENCOUNTER — Ambulatory Visit: Payer: BC Managed Care – PPO | Admitting: Physician Assistant

## 2021-06-19 ENCOUNTER — Ambulatory Visit: Payer: BC Managed Care – PPO | Admitting: Physician Assistant

## 2021-06-22 ENCOUNTER — Ambulatory Visit: Payer: BC Managed Care – PPO | Admitting: Physician Assistant

## 2021-06-22 ENCOUNTER — Encounter: Payer: Self-pay | Admitting: Physician Assistant

## 2021-06-22 DIAGNOSIS — M25511 Pain in right shoulder: Secondary | ICD-10-CM | POA: Diagnosis not present

## 2021-06-22 NOTE — Progress Notes (Signed)
Office Visit Note   Patient: Courtney Bolton           Date of Birth: 1965/01/12           MRN: [id number] Visit Date: 06/22/2021              Requested by: Stevphen Rochester, MD [address] [place],  [place] [address] PCP: Stevphen Rochester, MD  Chief Complaint  Patient presents with   Right Shoulder - New Patient (Initial Visit)      HPI: Patient is a pleasant 56 year old woman with a history of diabetes who presents today with a chief complaint of right shoulder and biceps pain.  She has had right shoulder pain for 6 to 7 months but just ignored it.  She notices the pain when she goes overhead and does not have much motion going behind her back.  More recently a couple weeks ago she was at the gym doing some biceps exercises and she felt a snap in her right biceps.  She noticed the next day that she did have quite a bit of bruising.  She now reports pain in addition to her shoulder as running down the biceps muscle and has associated bruising.  Her primary care has ordered an MRI which is to be done later this week  Assessment & Plan: Visit Diagnoses:  1. Acute pain of right shoulder     Plan: Probable proximal right biceps tear.  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  Her exam is consistent with rotator cuff pathology as well as proximal biceps tearing.  I believe I was able to palpate her distal biceps tendon.  She will go forward with the MRI and follow-up for an MRI review next week with Dr. Cleophas Dunker.  I did not inject her today as I did not want to affect the images of the MRI in the meantime I showed her pendulum exercises to do to keep her shoulder from getting any stiffer.  Of note patient is left-handed  Follow-Up Instructions: After MRI  Ortho Exam  Patient is alert, oriented, no adenopathy, well-dressed, normal affect, normal respiratory effort. Examination of her  right arm she does have some bruising on the inner side of the biceps.  She has forward elevation to about 110 degrees and then has significant difficulty though I can bring her up further it is quite painful.  She has a negative drop arm test.  She has some impingement findings.  She is very tender over the insertion of the proximal biceps.  Her strength is 4 out of 5 with resisted external rotation resisted abduction.  Imaging: No results found. No images are attached to the encounter.  Labs: Lab Results  Component Value Date   HGBA1C 6.6 (H) 07/21/2011   HGBA1C 6.6 (H) 01/27/2011   LABORGA CITROBACTER KOSERI 03/01/2011     Lab Results  Component Value Date   ALBUMIN 3.6 11/04/2012   ALBUMIN 4.0 01/27/2011    No results found for: "MG" No results found for: "VD25OH"  No results found for: "PREALBUMIN"    Latest Ref Rng & Units 11/04/2012    7:54 PM 01/27/2011    9:40 AM  CBC EXTENDED  WBC 4.0 - 10.5 K/uL 12.3  8.6   RBC 3.87 - 5.11 MIL/uL 4.41  4.28   Hemoglobin 12.0 - 15.0 g/dL 19.1  11.9  HCT 36.0 - 46.0 % 37.6  36.6   Platelets 150 - 400 K/uL 377  414   NEUT# 1.7 - 7.7 K/uL 7.5  4.7   Lymph# 0.7 - 4.0 K/uL 3.6  3.2      There is no height or weight on file to calculate BMI.  Orders:  No orders of the defined types were placed in this encounter.  No orders of the defined types were placed in this encounter.    Procedures: No procedures performed  Clinical Data: No additional findings.  ROS:  All other systems negative, except as noted in the HPI. Review of Systems  Objective: Vital Signs: There were no vitals taken for this visit.  Specialty Comments:  No specialty comments available.  PMFS History: Patient Active Problem List   Diagnosis Date Noted   Pain in right shoulder 06/22/2021   Type II or unspecified type diabetes mellitus without mention of complication, not stated as uncontrolled 07/21/2011   Essential hypertension, benign  01/27/2011   Obesity 01/27/2011   Counseling on health promotion and disease prevention 01/27/2011   Need for prophylactic vaccination and inoculation against influenza 01/27/2011   Past Medical History:  Diagnosis Date   Diabetes mellitus    Diabetes type II dx 01/2011   Fibrocystic breast disease    Hypertension    Morbid obesity (HCC)    Peripheral edema     Family History  Problem Relation Age of Onset   Diabetes Mother    Hypertension Mother    Diabetes Sister    Hypertension Sister    Cancer Maternal Grandmother        luekemia   Heart disease Maternal Grandfather    Stroke Neg Hx     Past Surgical History:  Procedure Laterality Date   ABDOMINAL HYSTERECTOMY  2002   Dr. Audie Box; fibroids   REDUCTION MAMMAPLASTY     Social History   Occupational History   Not on file  Tobacco Use   Smoking status: Never   Smokeless tobacco: Not on file  Substance and Sexual Activity   Alcohol use: No   Drug use: No   Sexual activity: Not on file

## 2021-06-25 ENCOUNTER — Other Ambulatory Visit (HOSPITAL_BASED_OUTPATIENT_CLINIC_OR_DEPARTMENT_OTHER): Payer: Self-pay

## 2021-06-25 MED ORDER — TRULICITY 3 MG/0.5ML ~~LOC~~ SOAJ
SUBCUTANEOUS | 1 refills | Status: AC
  Filled 2021-06-25: qty 6, 84d supply, fill #0
  Filled 2021-06-25: qty 2, 28d supply, fill #0
  Filled 2021-09-28: qty 2, 28d supply, fill #1

## 2021-06-30 ENCOUNTER — Ambulatory Visit: Payer: BC Managed Care – PPO | Admitting: Orthopaedic Surgery

## 2021-07-07 ENCOUNTER — Ambulatory Visit: Payer: BC Managed Care – PPO | Admitting: Orthopaedic Surgery

## 2021-07-07 ENCOUNTER — Encounter: Payer: Self-pay | Admitting: Orthopaedic Surgery

## 2021-07-07 DIAGNOSIS — M25511 Pain in right shoulder: Secondary | ICD-10-CM

## 2021-07-08 ENCOUNTER — Ambulatory Visit: Payer: BC Managed Care – PPO | Admitting: Orthopedic Surgery

## 2021-07-08 ENCOUNTER — Other Ambulatory Visit (HOSPITAL_BASED_OUTPATIENT_CLINIC_OR_DEPARTMENT_OTHER): Payer: Self-pay

## 2021-07-08 ENCOUNTER — Encounter: Payer: Self-pay | Admitting: Orthopedic Surgery

## 2021-07-08 DIAGNOSIS — M25511 Pain in right shoulder: Secondary | ICD-10-CM | POA: Diagnosis not present

## 2021-07-08 MED ORDER — TRULICITY 3 MG/0.5ML ~~LOC~~ SOAJ
SUBCUTANEOUS | 1 refills | Status: AC
  Filled 2021-07-08 – 2021-09-28 (×2): qty 6, 84d supply, fill #0

## 2021-07-08 MED ORDER — JARDIANCE 10 MG PO TABS
ORAL_TABLET | ORAL | 1 refills | Status: AC
  Filled 2021-07-08: qty 90, 90d supply, fill #0

## 2021-07-08 MED ORDER — ATORVASTATIN CALCIUM 10 MG PO TABS
ORAL_TABLET | ORAL | 1 refills | Status: AC
  Filled 2021-07-08: qty 90, 90d supply, fill #0

## 2021-07-08 MED ORDER — FREESTYLE LIBRE 2 READER DEVI
0 refills | Status: AC
  Filled 2021-07-08: qty 1, 30d supply, fill #0

## 2021-07-08 MED ORDER — AMLODIPINE BESYLATE 10 MG PO TABS
ORAL_TABLET | ORAL | 1 refills | Status: AC
  Filled 2021-07-08: qty 90, 90d supply, fill #0

## 2021-07-08 MED ORDER — METFORMIN HCL 500 MG PO TABS
ORAL_TABLET | ORAL | 1 refills | Status: AC
  Filled 2021-07-08: qty 180, 90d supply, fill #0

## 2021-07-08 MED ORDER — FREESTYLE LIBRE 2 SENSOR MISC
11 refills | Status: AC
  Filled 2021-07-08: qty 2, 28d supply, fill #0

## 2021-07-08 MED ORDER — HYDROCHLOROTHIAZIDE 25 MG PO TABS
ORAL_TABLET | ORAL | 1 refills | Status: AC
  Filled 2021-07-08: qty 90, 90d supply, fill #0

## 2021-07-08 NOTE — Progress Notes (Signed)
Office Visit Note   Patient: Courtney Bolton           Date of Birth: Jun 14, 1965           MRN: [id number] Visit Date: 07/08/2021 Requested by: Stevphen Rochester, MD [address] [place],  [place] [address] PCP: Stevphen Rochester, MD  Subjective: Chief Complaint  Patient presents with   Right Shoulder - Pain    HPI: Is a 56 year old patient with right shoulder and humerus pain.  Been going on 6 months.  And in September 2022 she felt a pull getting into her husband's truck.  She had pain at that time.  She reports some radiating pain to the elbow with occasional numbness and tingling but denies any neck pain.  Takes Aleve with some relief.  Hemoglobin A1c 7.1.  Hard for her to take off her shirt.  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  The pain does keep her awake at night at times.  Her MRI scan is reviewed and does show supraspinatus tear with retraction to the glenoid rim and infraspinatus tear with 3 cm of retraction and atrophy in both muscle bellies.             ROS: All systems reviewed are negative as they relate to the chief complaint within the history of present illness.  Patient denies  fevers or chills.   Assessment & Plan: Visit Diagnoses:  1. Acute pain of right shoulder     Plan: Impression is right shoulder pain with chronic rotator cuff tearing with atrophy of the muscles.  Tears do not really look repairable particularly the supraspinatus.  Conceivably we may be able to get the infraspinatus over but I do not think it is can make much of a difference in terms of pain relief for her.  Any marginal improvement she would get from that restoration of infraspinatus strength would not really be worth the rehabilitation in an effort required.  If she had more weakness as a presenting problem then lower trapezius tendon transfer could be considered.  However she is able to achieve functional range of motion overhead.  At this time I do not think a surgery is  indicated for her current level of compensation from the rotator cuff tears.  That could change if she loses some of her force couple in terms of subscapularis and teres minor.  Encouraged her to come back if her symptoms worsen.  Could consider injection at that time for temporary pain relief.  Heading for either lower trapezius tendon transfer if weakness becomes predominant or potentially reverse shoulder replacement in 5 to 10 years if pain and arthritis become more predominant.  Plan to see her back in about 9 months for clinical recheck  Follow-Up Instructions: Return if symptoms worsen or fail to improve.   Orders:  No orders of the defined types were placed in this encounter.  No orders of the defined types were placed in this encounter.     Procedures: No procedures performed   Clinical Data: No additional findings.  Objective: Vital Signs: There were no vitals taken for this visit.  Physical Exam:   Constitutional: Patient appears well-developed HEENT:  Head: Normocephalic Eyes:EOM are normal Neck: Normal range of motion Cardiovascular: Normal rate Pulmonary/chest: Effort normal Neurologic: Patient is alert Skin: Skin is warm Psychiatric: Patient has normal mood and affect   Ortho Exam: Ortho exam demonstrates good cervical spine range of motion.  5 out of  5 grip EPL FPL interosseous wrist flexion extension bicep triceps and deltoid strength.  Radial pulse intact bilaterally.  She has 4 out of 5 infra and supraspinatus strength on the right compared to the left.  Passively her range of motion is 75/95/170.  On the right-hand side she does have forward flexion and abduction both above 90 degrees.  Specialty Comments:  No specialty comments available.  Imaging: No results found.   PMFS History: Patient Active Problem List   Diagnosis Date Noted   Pain in right shoulder 06/22/2021   Type II or unspecified type diabetes mellitus without mention of complication,  not stated as uncontrolled 07/21/2011   Essential hypertension, benign 01/27/2011   Obesity 01/27/2011   Counseling on health promotion and disease prevention 01/27/2011   Need for prophylactic vaccination and inoculation against influenza 01/27/2011   Past Medical History:  Diagnosis Date   Diabetes mellitus    Diabetes type II dx 01/2011   Fibrocystic breast disease    Hypertension    Morbid obesity (HCC)    Peripheral edema     Family History  Problem Relation Age of Onset   Diabetes Mother    Hypertension Mother    Diabetes Sister    Hypertension Sister    Cancer Maternal Grandmother        luekemia   Heart disease Maternal Grandfather    Stroke Neg Hx     Past Surgical History:  Procedure Laterality Date   ABDOMINAL HYSTERECTOMY  2002   Dr. Audie Box; fibroids   REDUCTION MAMMAPLASTY     Social History   Occupational History   Not on file  Tobacco Use   Smoking status: Never   Smokeless tobacco: Not on file  Substance and Sexual Activity   Alcohol use: No   Drug use: No   Sexual activity: Not on file

## 2021-07-27 ENCOUNTER — Other Ambulatory Visit (HOSPITAL_BASED_OUTPATIENT_CLINIC_OR_DEPARTMENT_OTHER): Payer: Self-pay

## 2021-07-27 MED ORDER — TRULICITY 3 MG/0.5ML ~~LOC~~ SOPN
PEN_INJECTOR | SUBCUTANEOUS | 1 refills | Status: AC
Start: 2021-07-27 — End: ?
  Filled 2021-07-27: qty 2, 28d supply, fill #0

## 2021-08-04 ENCOUNTER — Other Ambulatory Visit (HOSPITAL_BASED_OUTPATIENT_CLINIC_OR_DEPARTMENT_OTHER): Payer: Self-pay

## 2021-08-04 MED ORDER — AMOXICILLIN-POT CLAVULANATE 875-125 MG PO TABS
ORAL_TABLET | ORAL | 0 refills | Status: AC
  Filled 2021-08-04: qty 14, 7d supply, fill #0

## 2021-08-04 MED ORDER — PREDNISONE 10 MG PO TABS
ORAL_TABLET | ORAL | 0 refills | Status: AC
  Filled 2021-08-04: qty 21, 6d supply, fill #0

## 2021-08-07 ENCOUNTER — Other Ambulatory Visit (HOSPITAL_BASED_OUTPATIENT_CLINIC_OR_DEPARTMENT_OTHER): Payer: Self-pay

## 2021-08-07 MED ORDER — JARDIANCE 10 MG PO TABS
10.0000 mg | ORAL_TABLET | Freq: Every day | ORAL | 1 refills | Status: AC
  Filled 2021-08-07: qty 90, 90d supply, fill #0

## 2021-08-07 MED ORDER — HYDROCHLOROTHIAZIDE 25 MG PO TABS
25.0000 mg | ORAL_TABLET | Freq: Every day | ORAL | 1 refills | Status: AC
  Filled 2021-08-07: qty 90, 90d supply, fill #0

## 2021-08-20 ENCOUNTER — Other Ambulatory Visit (HOSPITAL_BASED_OUTPATIENT_CLINIC_OR_DEPARTMENT_OTHER): Payer: Self-pay

## 2021-08-20 MED ORDER — TRULICITY 3 MG/0.5ML ~~LOC~~ SOAJ
SUBCUTANEOUS | 1 refills | Status: AC
  Filled 2021-08-20: qty 2, 28d supply, fill #0

## 2021-09-04 ENCOUNTER — Other Ambulatory Visit (HOSPITAL_BASED_OUTPATIENT_CLINIC_OR_DEPARTMENT_OTHER): Payer: Self-pay

## 2021-09-04 MED ORDER — FREESTYLE LIBRE 14 DAY SENSOR MISC
11 refills | Status: AC
  Filled 2021-09-04: qty 2, 28d supply, fill #0
  Filled 2022-08-22: qty 2, 28d supply, fill #1

## 2021-09-07 ENCOUNTER — Other Ambulatory Visit (HOSPITAL_BASED_OUTPATIENT_CLINIC_OR_DEPARTMENT_OTHER): Payer: Self-pay

## 2021-09-16 ENCOUNTER — Other Ambulatory Visit (HOSPITAL_BASED_OUTPATIENT_CLINIC_OR_DEPARTMENT_OTHER): Payer: Self-pay

## 2021-09-16 MED ORDER — TRULICITY 3 MG/0.5ML ~~LOC~~ SOAJ: 3.0000 mg | SUBCUTANEOUS | 1 refills | Status: AC

## 2021-09-28 ENCOUNTER — Other Ambulatory Visit (HOSPITAL_BASED_OUTPATIENT_CLINIC_OR_DEPARTMENT_OTHER): Payer: Self-pay

## 2021-10-09 ENCOUNTER — Other Ambulatory Visit (HOSPITAL_BASED_OUTPATIENT_CLINIC_OR_DEPARTMENT_OTHER): Payer: Self-pay

## 2021-10-09 MED ORDER — METFORMIN HCL 500 MG PO TABS
1000.0000 mg | ORAL_TABLET | Freq: Every evening | ORAL | 1 refills | Status: DC
Start: 2021-10-09 — End: 2022-04-22
  Filled 2021-10-09: qty 180, 90d supply, fill #0
  Filled 2022-01-07: qty 180, 90d supply, fill #1

## 2021-10-23 ENCOUNTER — Other Ambulatory Visit (HOSPITAL_BASED_OUTPATIENT_CLINIC_OR_DEPARTMENT_OTHER): Payer: Self-pay

## 2021-10-23 MED ORDER — FREESTYLE LIBRE 3 SENSOR MISC
1 refills | Status: DC
  Filled 2021-10-23: qty 6, 84d supply, fill #0
  Filled 2022-01-25: qty 6, 84d supply, fill #1

## 2021-10-27 ENCOUNTER — Other Ambulatory Visit (HOSPITAL_BASED_OUTPATIENT_CLINIC_OR_DEPARTMENT_OTHER): Payer: Self-pay

## 2021-10-28 ENCOUNTER — Other Ambulatory Visit (HOSPITAL_BASED_OUTPATIENT_CLINIC_OR_DEPARTMENT_OTHER): Payer: Self-pay

## 2021-10-28 MED ORDER — TRULICITY 3 MG/0.5ML ~~LOC~~ SOAJ
SUBCUTANEOUS | 1 refills | Status: AC
  Filled 2021-10-28: qty 4, 56d supply, fill #0
  Filled 2021-12-18: qty 2, 28d supply, fill #1
  Filled 2022-01-15: qty 2, 28d supply, fill #2
  Filled 2022-03-09: qty 2, 28d supply, fill #3
  Filled 2022-04-05: qty 2, 28d supply, fill #4

## 2021-11-02 ENCOUNTER — Other Ambulatory Visit (HOSPITAL_BASED_OUTPATIENT_CLINIC_OR_DEPARTMENT_OTHER): Payer: Self-pay

## 2021-11-02 MED ORDER — JARDIANCE 10 MG PO TABS
10.0000 mg | ORAL_TABLET | Freq: Every day | ORAL | 1 refills | Status: DC
  Filled 2021-11-02: qty 90, 90d supply, fill #0
  Filled 2021-12-11 – 2022-01-15 (×2): qty 90, 90d supply, fill #1

## 2021-11-02 MED ORDER — HYDROCHLOROTHIAZIDE 25 MG PO TABS
25.0000 mg | ORAL_TABLET | Freq: Every day | ORAL | 1 refills | Status: DC
  Filled 2021-11-02: qty 90, 90d supply, fill #0
  Filled 2022-01-25: qty 90, 90d supply, fill #1

## 2021-11-16 ENCOUNTER — Ambulatory Visit (INDEPENDENT_AMBULATORY_CARE_PROVIDER_SITE_OTHER): Payer: BC Managed Care – PPO

## 2021-11-16 ENCOUNTER — Ambulatory Visit: Payer: BC Managed Care – PPO | Admitting: Orthopedic Surgery

## 2021-11-16 ENCOUNTER — Encounter: Payer: Self-pay | Admitting: Orthopedic Surgery

## 2021-11-16 DIAGNOSIS — M25511 Pain in right shoulder: Secondary | ICD-10-CM

## 2021-11-16 NOTE — Progress Notes (Signed)
Office Visit Note   Patient: Courtney Bolton           Date of Birth: Sep 04, 1965           MRN: [id number] Visit Date: 11/16/2021 Requested by: Jolinda Croak, [place],  [place] [address] PCP: Jolinda Croak, MD  Subjective: Chief Complaint  Patient presents with   Right Shoulder - Pain    HPI: Courtney Bolton is a 56 y.o. female who presents to the office reporting right shoulder pain.  Previously seen in June.  Noted to have massive rotator cuff tear at that time but reasonably good function including forward flexion abduction both above 90 degrees.  Recently she developed bruising and swelling around the medial aspect of the humerus.  No injury.  Reports more shoulder cracking along with decreased functional range of motion since then.  Hard for her to sleep on the right-hand side.  She describes "new noise" in the shoulder.  Pain is a bigger problem weakness.  Taking over-the-counter medication..                ROS: All systems reviewed are negative as they relate to the chief complaint within the history of present illness.  Patient denies fevers or chills.  Assessment & Plan: Visit Diagnoses:  1. 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  I think it is possible that the biceps tendon was stuck above the transverse humeral ligament but now has fallen below the transverse humeral ligament.  Subscap appears to be intact on ultrasound examination.  Plan at this time is to come back in 3 weeks for right shoulder ultrasound-guided injection.  If she does have a partial tear of that subscap would like to let that heal a little bit before we consider putting an injection in the joint.  Follow-Up Instructions: No follow-ups on file.   Orders:  Orders Placed This Encounter  Procedures   XR Shoulder Right    No orders of the defined types were placed in this encounter.     Procedures: No procedures performed   Clinical Data: No additional findings.  Objective: Vital Signs: There were no vitals taken for this visit.  Physical Exam:  Constitutional: Patient appears well-developed HEENT:  Head: Normocephalic Eyes:EOM are normal Neck: Normal range of motion Cardiovascular: Normal rate Pulmonary/chest: Effort normal Neurologic: Patient is alert Skin: Skin is warm Psychiatric: Patient has normal mood and affect  Ortho Exam: Ortho exam demonstrates active forward flexion and abduction to about 70 degrees.  Subscap strength is about 5- out of 5 on the right 5+ out of 5 on the left.  Passive range of motion is 60/90/145.  Deltoid fires.  There is some bruising ecchymosis around the mid medial aspect of the humeral shaft anteriorly.  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  There is sensory function to the hand is intact.  Specialty Comments:  No specialty comments available.  Imaging: XR Shoulder Right  Result Date: 11/16/2021 AP axillary outlet radiographs right shoulder reviewed.  No acute fracture.  Shoulder is located.  Mild decrease in acromiohumeral distance.  Minimal glenohumeral joint arthritis.  Visualized lung fields clear.  No definite acromial fracture present    PMFS History: Patient Active Problem List   Diagnosis Date Noted   Pain in right  shoulder 06/22/2021   Type II or unspecified type diabetes mellitus without mention of complication, not stated as uncontrolled 07/21/2011   Essential hypertension, benign 01/27/2011   Obesity 01/27/2011   Counseling on health promotion and disease prevention 01/27/2011   Need for prophylactic vaccination and inoculation against influenza 01/27/2011   Past Medical History:  Diagnosis Date   Diabetes mellitus    Diabetes type II dx 01/2011   Fibrocystic breast disease    Hypertension    Morbid obesity (HCC)     Peripheral edema     Family History  Problem Relation Age of Onset   Diabetes Mother    Hypertension Mother    Diabetes Sister    Hypertension Sister    Cancer Maternal Grandmother        luekemia   Heart disease Maternal Grandfather    Stroke Neg Hx     Past Surgical History:  Procedure Laterality Date   ABDOMINAL HYSTERECTOMY  2002   Dr. Audie Box; fibroids   REDUCTION MAMMAPLASTY     Social History   Occupational History   Not on file  Tobacco Use   Smoking status: Never   Smokeless tobacco: Not on file  Substance and Sexual Activity   Alcohol use: No   Drug use: No   Sexual activity: Not on file

## 2021-11-18 ENCOUNTER — Other Ambulatory Visit (HOSPITAL_BASED_OUTPATIENT_CLINIC_OR_DEPARTMENT_OTHER): Payer: Self-pay

## 2021-11-18 ENCOUNTER — Ambulatory Visit: Payer: Self-pay

## 2021-11-18 ENCOUNTER — Ambulatory Visit: Payer: BC Managed Care – PPO | Admitting: Orthopedic Surgery

## 2021-11-18 ENCOUNTER — Encounter: Payer: Self-pay | Admitting: Orthopedic Surgery

## 2021-11-18 DIAGNOSIS — M19011 Primary osteoarthritis, right shoulder: Secondary | ICD-10-CM

## 2021-11-18 DIAGNOSIS — M25511 Pain in right shoulder: Secondary | ICD-10-CM

## 2021-11-18 MED ORDER — BUPIVACAINE HCL 0.5 % IJ SOLN
9.0000 mL | INTRAMUSCULAR | Status: AC | PRN
  Administered 2021-11-18: 9 mL via INTRA_ARTICULAR

## 2021-11-18 MED ORDER — METHYLPREDNISOLONE ACETATE 40 MG/ML IJ SUSP
40.0000 mg | INTRAMUSCULAR | Status: AC | PRN
  Administered 2021-11-18: 40 mg via INTRA_ARTICULAR

## 2021-11-18 MED ORDER — LIDOCAINE HCL 1 % IJ SOLN
5.0000 mL | INTRAMUSCULAR | Status: AC | PRN
  Administered 2021-11-18: 5 mL

## 2021-11-18 NOTE — Progress Notes (Signed)
   Procedure Note  Patient: Courtney Bolton             Date of Birth: [birth year]           MRN: [id number]             Visit Date: 11/18/2021  Procedures: Visit Diagnoses:  1. Right shoulder pain, unspecified chronicity     Large Joint Inj: R glenohumeral on 11/18/2021 10:42 PM Indications: diagnostic evaluation and pain Details: 18 G 1.5 in needle, ultrasound-guided posterior approach  Arthrogram: No  Medications: 9 mL bupivacaine 0.5 %; 40 mg methylPREDNISolone acetate 40 MG/ML; 5 mL lidocaine 1 % Outcome: tolerated well, no immediate complications Procedure, treatment alternatives, risks and benefits explained, specific risks discussed. Consent was given by the patient. Immediately prior to procedure a time out was called to verify the correct patient, procedure, equipment, support staff and site/side marked as required. Patient was prepped and draped in the usual sterile fashion.

## 2021-11-20 ENCOUNTER — Other Ambulatory Visit: Payer: Self-pay | Admitting: Family Medicine

## 2021-11-20 DIAGNOSIS — Z1231 Encounter for screening mammogram for malignant neoplasm of breast: Secondary | ICD-10-CM

## 2021-12-07 ENCOUNTER — Ambulatory Visit: Payer: BC Managed Care – PPO | Admitting: Orthopedic Surgery

## 2021-12-11 ENCOUNTER — Other Ambulatory Visit (HOSPITAL_BASED_OUTPATIENT_CLINIC_OR_DEPARTMENT_OTHER): Payer: Self-pay

## 2021-12-14 ENCOUNTER — Ambulatory Visit
Admission: RE | Admit: 2021-12-14 | Discharge: 2021-12-14 | Disposition: A | Discharge status: Home / Self Care | Payer: BC Managed Care – PPO | Source: Ambulatory Visit | Attending: Family Medicine | Admitting: Family Medicine

## 2021-12-14 DIAGNOSIS — Z1231 Encounter for screening mammogram for malignant neoplasm of breast: Secondary | ICD-10-CM

## 2021-12-18 ENCOUNTER — Other Ambulatory Visit: Payer: Self-pay

## 2021-12-18 ENCOUNTER — Other Ambulatory Visit (HOSPITAL_BASED_OUTPATIENT_CLINIC_OR_DEPARTMENT_OTHER): Payer: Self-pay

## 2022-01-15 ENCOUNTER — Other Ambulatory Visit (HOSPITAL_BASED_OUTPATIENT_CLINIC_OR_DEPARTMENT_OTHER): Payer: Self-pay

## 2022-01-15 ENCOUNTER — Other Ambulatory Visit: Payer: Self-pay

## 2022-01-26 ENCOUNTER — Other Ambulatory Visit (HOSPITAL_BASED_OUTPATIENT_CLINIC_OR_DEPARTMENT_OTHER): Payer: Self-pay

## 2022-01-26 ENCOUNTER — Other Ambulatory Visit: Payer: Self-pay

## 2022-03-10 ENCOUNTER — Other Ambulatory Visit: Payer: Self-pay

## 2022-04-08 ENCOUNTER — Ambulatory Visit: Payer: BC Managed Care – PPO | Admitting: Orthopedic Surgery

## 2022-04-12 ENCOUNTER — Other Ambulatory Visit (HOSPITAL_BASED_OUTPATIENT_CLINIC_OR_DEPARTMENT_OTHER): Payer: Self-pay

## 2022-04-12 ENCOUNTER — Ambulatory Visit: Payer: BC Managed Care – PPO | Admitting: Orthopedic Surgery

## 2022-04-12 ENCOUNTER — Encounter: Payer: Self-pay | Admitting: Orthopedic Surgery

## 2022-04-12 DIAGNOSIS — M19011 Primary osteoarthritis, right shoulder: Secondary | ICD-10-CM | POA: Diagnosis not present

## 2022-04-12 MED ORDER — FREESTYLE LIBRE 3 SENSOR MISC
0 refills | Status: DC
  Filled 2022-04-12: qty 6, 84d supply, fill #0

## 2022-04-12 NOTE — Progress Notes (Signed)
Office Visit Note   Patient: Courtney Bolton           Date of Birth: January 27, 1965           MRN: [id number] Visit Date: 04/12/2022 Requested by: Jolinda Croak, [place],  [place] [address] PCP: Jolinda Croak, MD  Subjective: Chief Complaint  Patient presents with   Right Shoulder - Follow-up    HPI: Courtney Bolton is a 57 y.o. female who presents to the office reporting improved right shoulder pain.  She had an injection 11/18/2021.  Got about 75% relief from the injection.  She is more functional.  Has some limitations with range of motion.  Takes no medication except occasional Aleve.  Not as much wakes up pain since the injection..                ROS: All systems reviewed are negative as they relate to the chief complaint within the history of present illness.  Patient denies fevers or chills.  Assessment & Plan: Visit Diagnoses:  1. Arthritis of right shoulder region     Plan: Impression is improvement in right shoulder arthritis and rotator cuff arthropathy following glenohumeral joint injection.  We could do that a couple more times this year if needed.  I think she is being fairly deliberate about staying within her zone of safe function with that right shoulder.  She will follow-up as needed.  Follow-Up Instructions: No follow-ups on file.   Orders:  No orders of the defined types were placed in this encounter.  No orders of the defined types were placed in this encounter.     Procedures: No procedures performed   Clinical Data: No additional findings.  Objective: Vital Signs: There were no vitals taken for this visit.  Physical Exam:  Constitutional: Patient appears well-developed HEENT:  Head: Normocephalic Eyes:EOM are normal Neck: Normal range of motion Cardiovascular: Normal rate Pulmonary/chest: Effort normal Neurologic: Patient is alert Skin: Skin is warm Psychiatric: Patient has normal mood  and affect  Ortho Exam: Ortho exam demonstrates forward flexion and abduction both above 90 degrees.  Has a little bit of weakness to external rotation on the right but subscap strength is 5+ out of 5.  Some crepitus with internal/external rotation at 90 degrees of abduction.  Specialty Comments:  No specialty comments available.  Imaging: No results found.   PMFS History: Patient Active Problem List   Diagnosis Date Noted   Pain in right shoulder 06/22/2021   Type II or unspecified type diabetes mellitus without mention of complication, not stated as uncontrolled 07/21/2011   Essential hypertension, benign 01/27/2011   Obesity 01/27/2011   Counseling on health promotion and disease prevention 01/27/2011   Need for prophylactic vaccination and inoculation against influenza 01/27/2011   Past Medical History:  Diagnosis Date   Diabetes mellitus    Diabetes type II dx 01/2011   Fibrocystic breast disease    Hypertension    Morbid obesity    Peripheral edema     Family History  Problem Relation Age of Onset   Diabetes Mother    Hypertension Mother    Diabetes Sister    Hypertension Sister    Cancer Maternal Grandmother        luekemia   Heart disease Maternal Grandfather    Stroke Neg Hx     Past Surgical History:  Procedure Laterality Date   ABDOMINAL HYSTERECTOMY  2002   Dr.  Fontaine; fibroids   REDUCTION MAMMAPLASTY     Social History   Occupational History   Not on file  Tobacco Use   Smoking status: Never   Smokeless tobacco: Not on file  Substance and Sexual Activity   Alcohol use: No   Drug use: No   Sexual activity: Not on file

## 2022-04-21 ENCOUNTER — Other Ambulatory Visit (HOSPITAL_BASED_OUTPATIENT_CLINIC_OR_DEPARTMENT_OTHER): Payer: Self-pay

## 2022-04-22 ENCOUNTER — Other Ambulatory Visit (HOSPITAL_BASED_OUTPATIENT_CLINIC_OR_DEPARTMENT_OTHER): Payer: Self-pay

## 2022-04-22 MED ORDER — JARDIANCE 10 MG PO TABS
10.0000 mg | ORAL_TABLET | Freq: Every day | ORAL | 1 refills | Status: AC
  Filled 2022-04-22: qty 90, 90d supply, fill #0
  Filled 2022-10-18: qty 90, 90d supply, fill #1

## 2022-04-22 MED ORDER — HYDROCHLOROTHIAZIDE 25 MG PO TABS
25.0000 mg | ORAL_TABLET | Freq: Every day | ORAL | 1 refills | Status: AC
  Filled 2022-04-22: qty 90, 90d supply, fill #0

## 2022-04-22 MED ORDER — METFORMIN HCL 500 MG PO TABS
1000.0000 mg | ORAL_TABLET | Freq: Every day | ORAL | 1 refills | Status: AC
  Filled 2022-04-22: qty 180, 90d supply, fill #0
  Filled 2023-01-10: qty 180, 90d supply, fill #1

## 2022-05-05 ENCOUNTER — Encounter (HOSPITAL_BASED_OUTPATIENT_CLINIC_OR_DEPARTMENT_OTHER): Payer: Self-pay

## 2022-05-05 ENCOUNTER — Other Ambulatory Visit (HOSPITAL_BASED_OUTPATIENT_CLINIC_OR_DEPARTMENT_OTHER): Payer: Self-pay

## 2022-05-07 ENCOUNTER — Other Ambulatory Visit (HOSPITAL_BASED_OUTPATIENT_CLINIC_OR_DEPARTMENT_OTHER): Payer: Self-pay

## 2022-05-07 MED ORDER — FREESTYLE LIBRE 3 SENSOR MISC
0 refills | Status: AC
  Filled 2022-05-07: qty 2, 28d supply, fill #0
  Filled 2022-07-23: qty 2, 28d supply, fill #1
  Filled 2023-01-10: qty 2, 28d supply, fill #2

## 2022-05-07 MED ORDER — JARDIANCE 10 MG PO TABS
10.0000 mg | ORAL_TABLET | Freq: Every day | ORAL | 1 refills | Status: DC
  Filled 2022-05-07 – 2022-07-23 (×2): qty 90, 90d supply, fill #0
  Filled 2023-01-10: qty 90, 90d supply, fill #1

## 2022-05-07 MED ORDER — ATORVASTATIN CALCIUM 10 MG PO TABS
10.0000 mg | ORAL_TABLET | Freq: Every day | ORAL | 1 refills | Status: AC
  Filled 2022-05-07: qty 90, 90d supply, fill #0

## 2022-05-07 MED ORDER — METFORMIN HCL 500 MG PO TABS
1000.0000 mg | ORAL_TABLET | Freq: Every day | ORAL | 1 refills | Status: DC
  Filled 2022-05-07 – 2022-07-23 (×2): qty 180, 90d supply, fill #0
  Filled 2022-10-18: qty 180, 90d supply, fill #1

## 2022-05-07 MED ORDER — HYDRALAZINE HCL 25 MG PO TABS
25.0000 mg | ORAL_TABLET | Freq: Two times a day (BID) | ORAL | 0 refills | Status: DC
  Filled 2022-05-07: qty 60, 30d supply, fill #0

## 2022-05-07 MED ORDER — TRULICITY 3 MG/0.5ML ~~LOC~~ SOAJ
3.0000 mg | SUBCUTANEOUS | 1 refills | Status: AC
  Filled 2022-05-07: qty 2, 28d supply, fill #0
  Filled 2022-06-03: qty 2, 28d supply, fill #1
  Filled 2022-07-01: qty 2, 28d supply, fill #2

## 2022-05-07 MED ORDER — HYDROCHLOROTHIAZIDE 25 MG PO TABS
25.0000 mg | ORAL_TABLET | Freq: Every day | ORAL | 1 refills | Status: AC
  Filled 2022-05-07 – 2022-06-03 (×2): qty 90, 90d supply, fill #0

## 2022-06-03 ENCOUNTER — Other Ambulatory Visit (HOSPITAL_BASED_OUTPATIENT_CLINIC_OR_DEPARTMENT_OTHER): Payer: Self-pay

## 2022-06-03 ENCOUNTER — Other Ambulatory Visit: Payer: Self-pay

## 2022-06-04 ENCOUNTER — Other Ambulatory Visit (HOSPITAL_BASED_OUTPATIENT_CLINIC_OR_DEPARTMENT_OTHER): Payer: Self-pay

## 2022-06-08 ENCOUNTER — Other Ambulatory Visit (HOSPITAL_BASED_OUTPATIENT_CLINIC_OR_DEPARTMENT_OTHER): Payer: Self-pay

## 2022-06-17 ENCOUNTER — Other Ambulatory Visit (HOSPITAL_BASED_OUTPATIENT_CLINIC_OR_DEPARTMENT_OTHER): Payer: Self-pay

## 2022-06-17 MED ORDER — HYDRALAZINE HCL 25 MG PO TABS
25.0000 mg | ORAL_TABLET | Freq: Two times a day (BID) | ORAL | 1 refills | Status: AC
  Filled 2022-06-17: qty 180, 90d supply, fill #0
  Filled 2023-01-10: qty 180, 90d supply, fill #1

## 2022-07-23 ENCOUNTER — Other Ambulatory Visit (HOSPITAL_BASED_OUTPATIENT_CLINIC_OR_DEPARTMENT_OTHER): Payer: Self-pay

## 2022-07-23 MED ORDER — TRULICITY 4.5 MG/0.5ML ~~LOC~~ SOAJ
4.5000 mg | SUBCUTANEOUS | 0 refills | Status: DC
  Filled 2022-07-23: qty 2, 28d supply, fill #0

## 2022-07-23 MED ORDER — CHLORTHALIDONE 25 MG PO TABS
25.0000 mg | ORAL_TABLET | Freq: Every day | ORAL | 0 refills | Status: DC
  Filled 2022-07-23: qty 30, 30d supply, fill #0

## 2022-08-16 ENCOUNTER — Other Ambulatory Visit (HOSPITAL_BASED_OUTPATIENT_CLINIC_OR_DEPARTMENT_OTHER): Payer: Self-pay

## 2022-08-16 MED ORDER — HYDRALAZINE HCL 50 MG PO TABS
50.0000 mg | ORAL_TABLET | Freq: Three times a day (TID) | ORAL | 1 refills | Status: DC
  Filled 2022-08-16: qty 270, 90d supply, fill #0
  Filled 2022-11-29: qty 270, 90d supply, fill #1

## 2022-08-20 ENCOUNTER — Other Ambulatory Visit (HOSPITAL_BASED_OUTPATIENT_CLINIC_OR_DEPARTMENT_OTHER): Payer: Self-pay

## 2022-08-20 MED ORDER — CHLORTHALIDONE 25 MG PO TABS
25.0000 mg | ORAL_TABLET | Freq: Every day | ORAL | 1 refills | Status: DC
  Filled 2022-08-20: qty 90, 90d supply, fill #0
  Filled 2022-11-20: qty 90, 90d supply, fill #1

## 2022-08-20 MED ORDER — TRULICITY 4.5 MG/0.5ML ~~LOC~~ SOAJ
4.5000 mg | SUBCUTANEOUS | 5 refills | Status: AC
  Filled 2022-08-20: qty 2, 28d supply, fill #0
  Filled 2022-11-25: qty 2, 28d supply, fill #1
  Filled 2023-03-20: qty 2, 28d supply, fill #2

## 2022-08-22 ENCOUNTER — Other Ambulatory Visit (HOSPITAL_BASED_OUTPATIENT_CLINIC_OR_DEPARTMENT_OTHER): Payer: Self-pay

## 2022-08-25 IMAGING — MG MM DIGITAL SCREENING BILAT W/ TOMO AND CAD
6 of 10 series · 6 of 30 positions shown · non-contrast
Comparison: Previous exam(s).

CLINICAL DATA: Screening.

EXAM:
DIGITAL SCREENING BILATERAL MAMMOGRAM WITH TOMOSYNTHESIS AND CAD
TECHNIQUE: Bilateral screening digital craniocaudal and mediolateral oblique
mammograms were obtained. Bilateral screening digital breast
tomosynthesis was performed. The images were evaluated with
computer-aided detection.

[L MLO synth-2D]
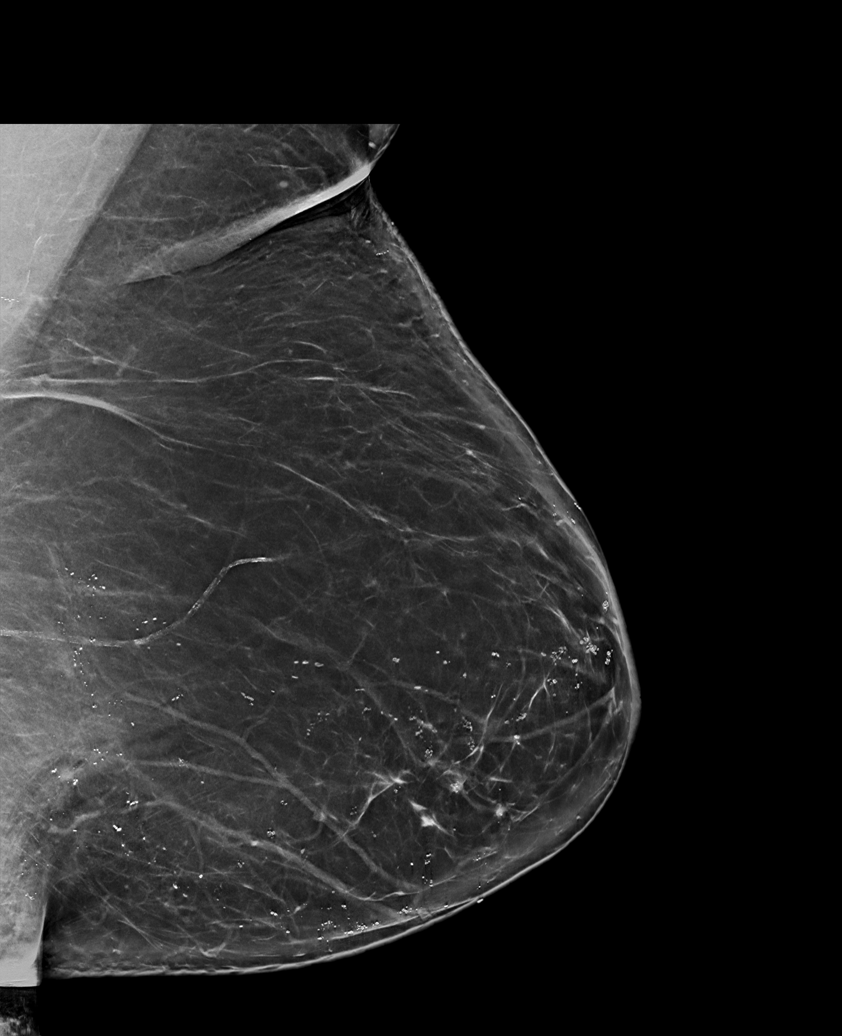

[R CV synth-2D]
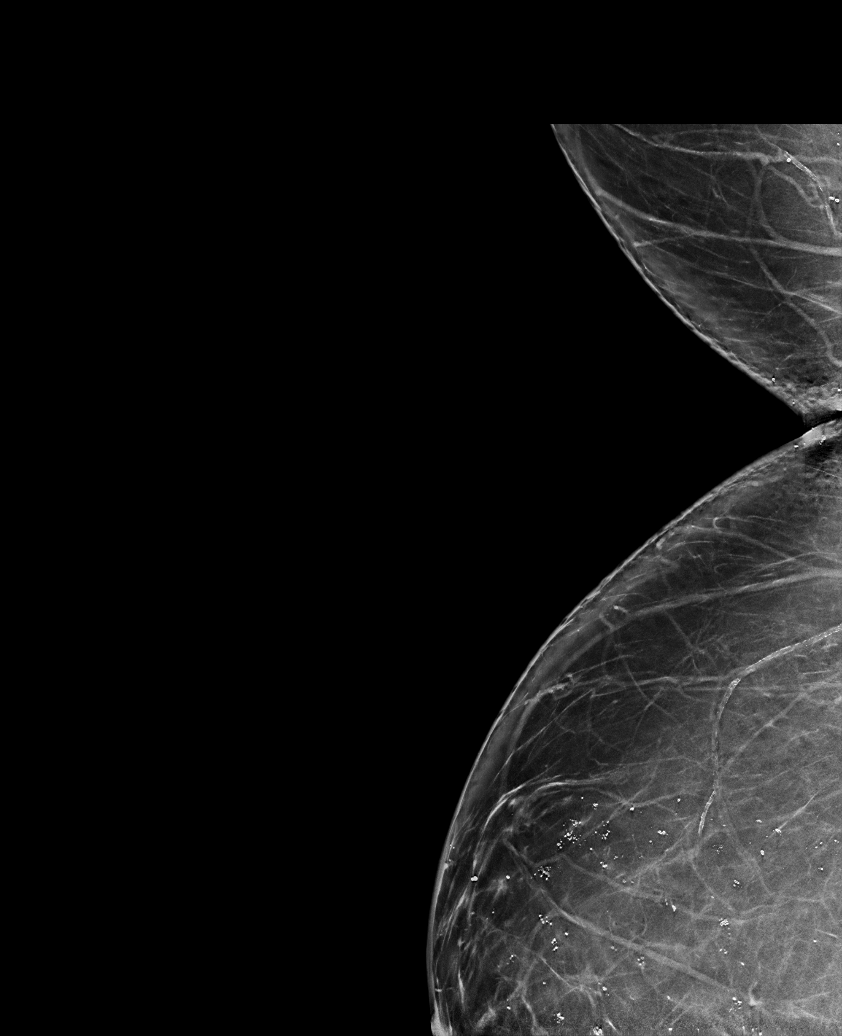

[L CC synth-2D]
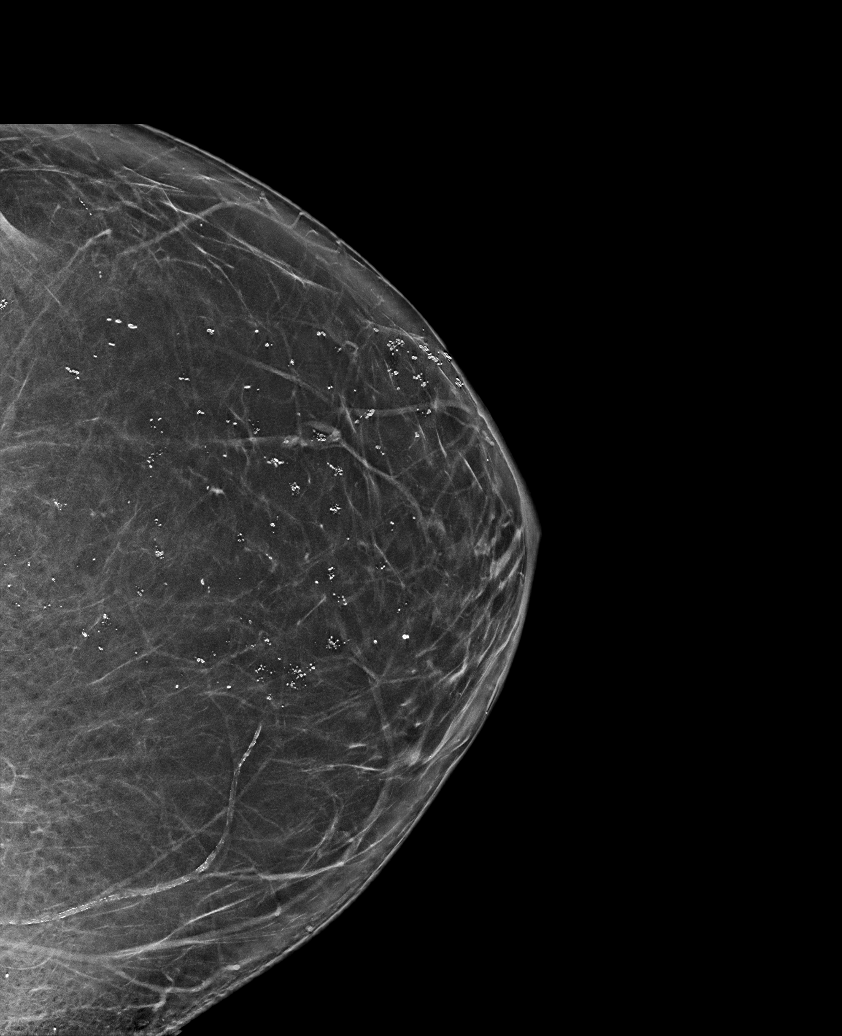

[R MLO synth-2D]
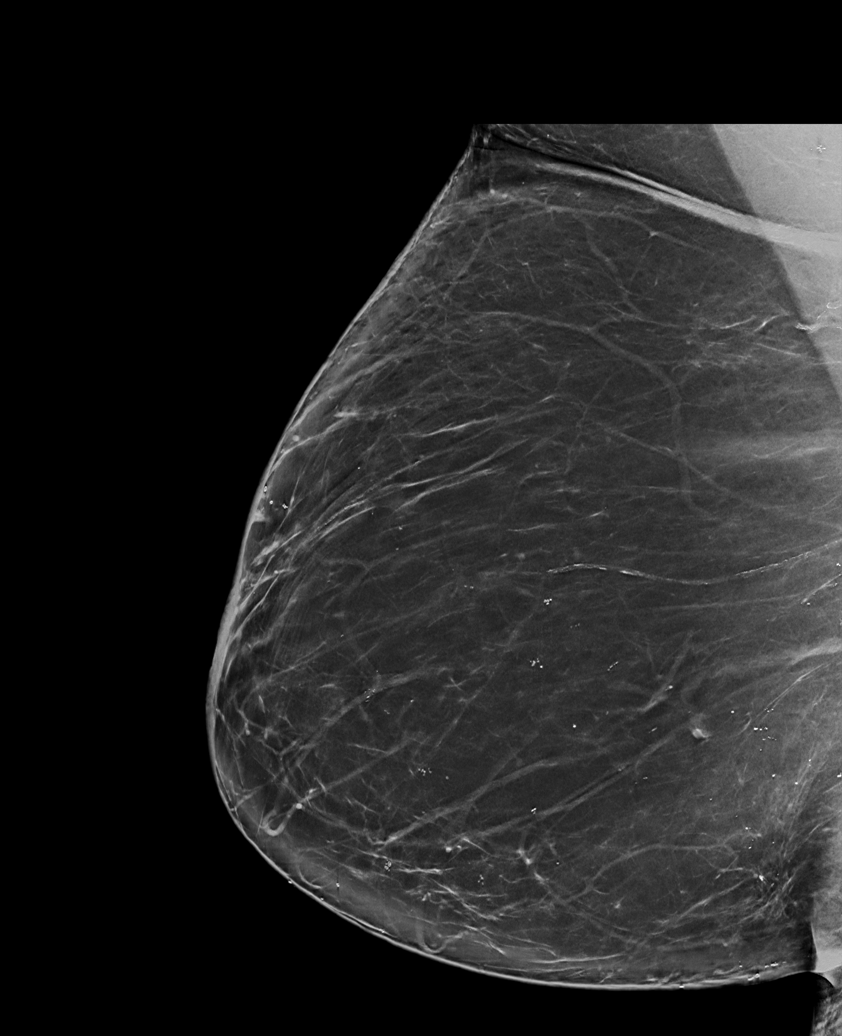

[R CC synth-2D]
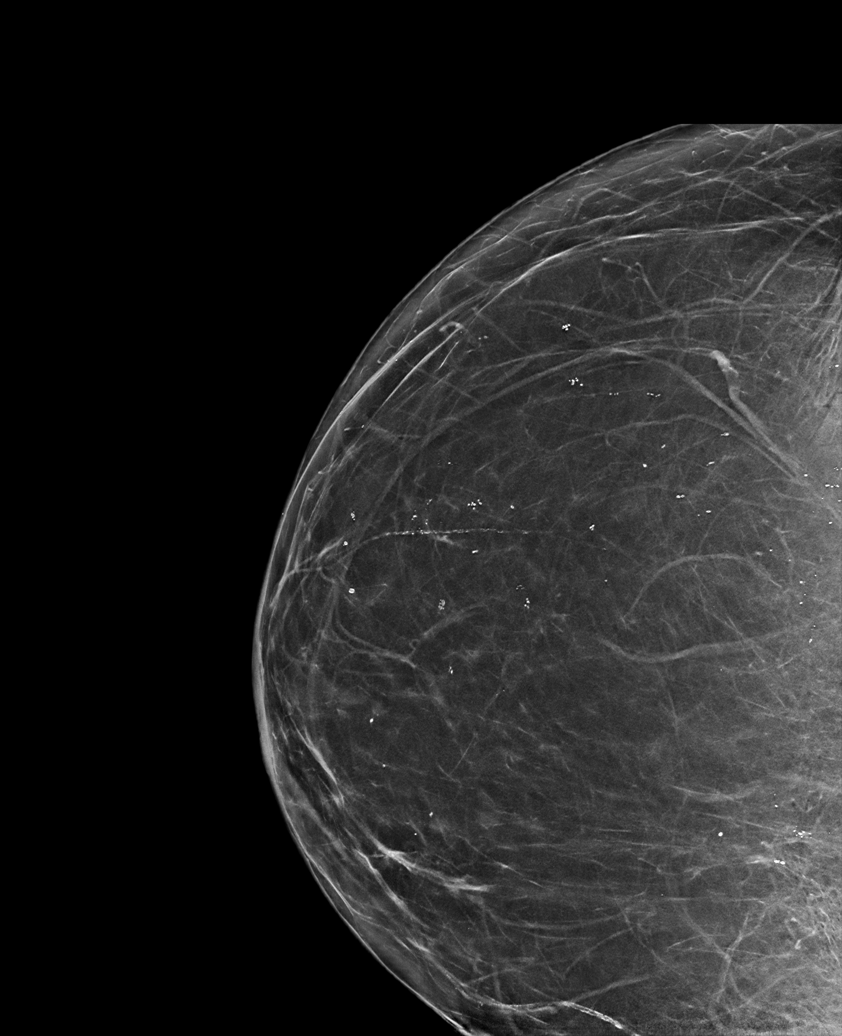

[R MLO tomo · tomo slice 47/94.0]
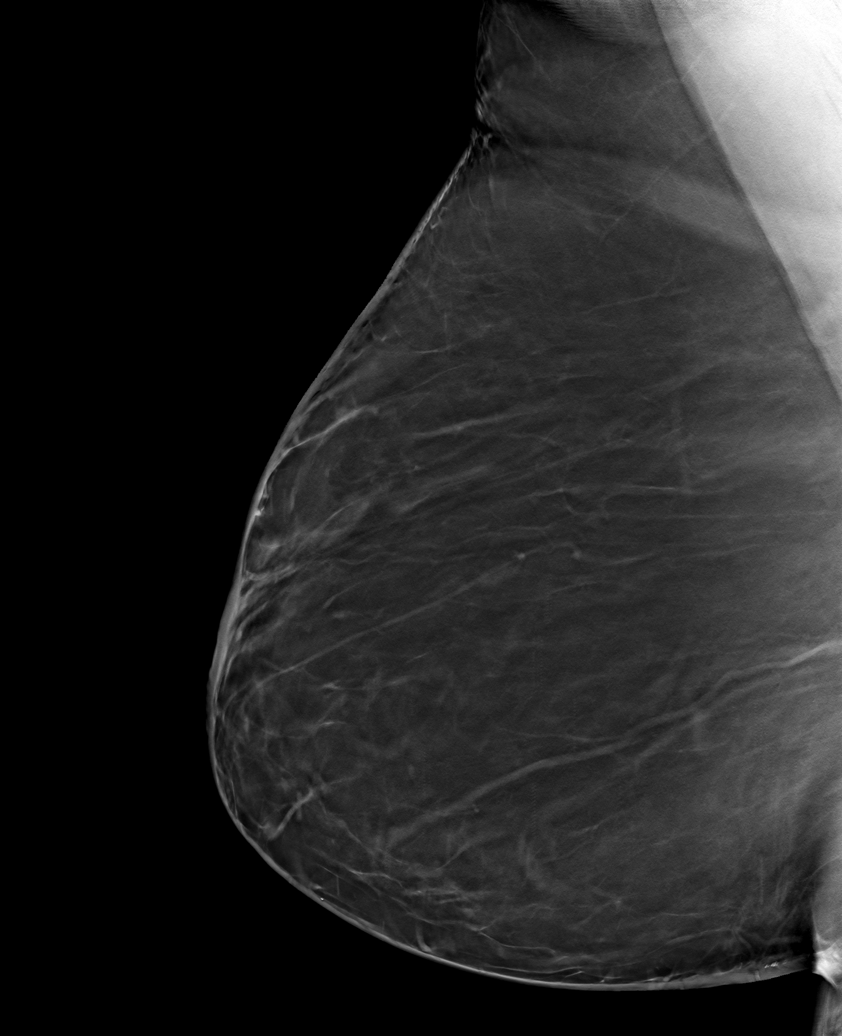

[6 of 30 positions shown; findings below may reference images not displayed]

ACR Breast Density Category b: There are scattered areas of
fibroglandular density.
FINDINGS: There are no findings suspicious for malignancy. The images were
evaluated with computer-aided detection.
IMPRESSION: No mammographic evidence of malignancy. A result letter of this
screening mammogram will be mailed directly to the patient.

RECOMMENDATION:
Screening mammogram in one year. (Code:[id number])

BI-RADS CATEGORY  1: Negative.

## 2022-09-22 ENCOUNTER — Other Ambulatory Visit (HOSPITAL_BASED_OUTPATIENT_CLINIC_OR_DEPARTMENT_OTHER): Payer: Self-pay

## 2022-09-22 MED ORDER — TRULICITY 4.5 MG/0.5ML ~~LOC~~ SOAJ
4.5000 mg | SUBCUTANEOUS | 5 refills | Status: DC
  Filled 2022-09-22: qty 2, 28d supply, fill #0
  Filled 2022-10-18: qty 2, 28d supply, fill #1
  Filled 2022-12-22: qty 2, 28d supply, fill #2
  Filled 2023-01-10 – 2023-01-17 (×2): qty 2, 28d supply, fill #3
  Filled 2023-03-14 – 2023-09-19 (×2): qty 2, 28d supply, fill #4

## 2022-09-22 MED ORDER — FREESTYLE LIBRE 3 SENSOR MISC
1.0000 | 0 refills | Status: DC
  Filled 2022-09-22: qty 6, 84d supply, fill #0

## 2022-11-25 ENCOUNTER — Other Ambulatory Visit (HOSPITAL_BASED_OUTPATIENT_CLINIC_OR_DEPARTMENT_OTHER): Payer: Self-pay

## 2022-11-29 ENCOUNTER — Other Ambulatory Visit (HOSPITAL_BASED_OUTPATIENT_CLINIC_OR_DEPARTMENT_OTHER): Payer: Self-pay

## 2022-11-30 ENCOUNTER — Other Ambulatory Visit: Payer: Self-pay | Admitting: Family Medicine

## 2022-11-30 DIAGNOSIS — Z1231 Encounter for screening mammogram for malignant neoplasm of breast: Secondary | ICD-10-CM

## 2022-12-22 ENCOUNTER — Other Ambulatory Visit: Payer: Self-pay

## 2022-12-22 ENCOUNTER — Other Ambulatory Visit (HOSPITAL_BASED_OUTPATIENT_CLINIC_OR_DEPARTMENT_OTHER): Payer: Self-pay

## 2022-12-22 MED ORDER — FREESTYLE LIBRE 3 SENSOR MISC
3 refills | Status: AC
  Filled 2022-12-22: qty 2, 28d supply, fill #0

## 2022-12-29 ENCOUNTER — Ambulatory Visit
Admission: RE | Admit: 2022-12-29 | Discharge: 2022-12-29 | Disposition: A | Discharge status: Home / Self Care | Payer: BC Managed Care – PPO | Source: Ambulatory Visit | Attending: Family Medicine | Admitting: Family Medicine

## 2022-12-29 DIAGNOSIS — Z1231 Encounter for screening mammogram for malignant neoplasm of breast: Secondary | ICD-10-CM

## 2023-01-10 ENCOUNTER — Other Ambulatory Visit (HOSPITAL_BASED_OUTPATIENT_CLINIC_OR_DEPARTMENT_OTHER): Payer: Self-pay

## 2023-03-03 ENCOUNTER — Other Ambulatory Visit (HOSPITAL_BASED_OUTPATIENT_CLINIC_OR_DEPARTMENT_OTHER): Payer: Self-pay

## 2023-03-04 ENCOUNTER — Other Ambulatory Visit: Payer: Self-pay

## 2023-03-04 ENCOUNTER — Other Ambulatory Visit (HOSPITAL_BASED_OUTPATIENT_CLINIC_OR_DEPARTMENT_OTHER): Payer: Self-pay

## 2023-03-04 MED ORDER — CHLORTHALIDONE 25 MG PO TABS
25.0000 mg | ORAL_TABLET | Freq: Every day | ORAL | 0 refills | Status: DC
  Filled 2023-03-04: qty 90, 90d supply, fill #0

## 2023-03-14 ENCOUNTER — Other Ambulatory Visit (HOSPITAL_BASED_OUTPATIENT_CLINIC_OR_DEPARTMENT_OTHER): Payer: Self-pay

## 2023-03-15 ENCOUNTER — Other Ambulatory Visit (HOSPITAL_BASED_OUTPATIENT_CLINIC_OR_DEPARTMENT_OTHER): Payer: Self-pay

## 2023-03-15 MED ORDER — HYDRALAZINE HCL 50 MG PO TABS
50.0000 mg | ORAL_TABLET | Freq: Three times a day (TID) | ORAL | 1 refills | Status: DC
  Filled 2023-03-15: qty 270, 90d supply, fill #0
  Filled 2023-04-21: qty 270, 90d supply, fill #1

## 2023-03-21 ENCOUNTER — Encounter (HOSPITAL_BASED_OUTPATIENT_CLINIC_OR_DEPARTMENT_OTHER): Payer: Self-pay

## 2023-03-21 ENCOUNTER — Other Ambulatory Visit (HOSPITAL_BASED_OUTPATIENT_CLINIC_OR_DEPARTMENT_OTHER): Payer: Self-pay

## 2023-04-21 ENCOUNTER — Other Ambulatory Visit: Payer: Self-pay

## 2023-04-21 ENCOUNTER — Other Ambulatory Visit (HOSPITAL_BASED_OUTPATIENT_CLINIC_OR_DEPARTMENT_OTHER): Payer: Self-pay

## 2023-04-21 MED ORDER — METFORMIN HCL 500 MG PO TABS
1000.0000 mg | ORAL_TABLET | Freq: Every day | ORAL | 1 refills | Status: DC
  Filled 2023-04-21: qty 180, 90d supply, fill #0
  Filled 2023-07-20: qty 180, 90d supply, fill #1

## 2023-04-26 ENCOUNTER — Other Ambulatory Visit (HOSPITAL_BASED_OUTPATIENT_CLINIC_OR_DEPARTMENT_OTHER): Payer: Self-pay

## 2023-04-26 MED ORDER — JARDIANCE 10 MG PO TABS
10.0000 mg | ORAL_TABLET | Freq: Every day | ORAL | 1 refills | Status: DC
  Filled 2023-04-26: qty 90, 90d supply, fill #0
  Filled 2023-07-26: qty 90, 90d supply, fill #1

## 2023-04-27 ENCOUNTER — Other Ambulatory Visit (HOSPITAL_BASED_OUTPATIENT_CLINIC_OR_DEPARTMENT_OTHER): Payer: Self-pay

## 2023-04-28 ENCOUNTER — Other Ambulatory Visit (HOSPITAL_BASED_OUTPATIENT_CLINIC_OR_DEPARTMENT_OTHER): Payer: Self-pay

## 2023-04-28 ENCOUNTER — Other Ambulatory Visit: Payer: Self-pay

## 2023-06-16 ENCOUNTER — Other Ambulatory Visit (HOSPITAL_BASED_OUTPATIENT_CLINIC_OR_DEPARTMENT_OTHER): Payer: Self-pay

## 2023-06-16 MED ORDER — CHLORTHALIDONE 25 MG PO TABS
25.0000 mg | ORAL_TABLET | Freq: Every day | ORAL | 0 refills | Status: DC
  Filled 2023-06-16: qty 90, 90d supply, fill #0

## 2023-08-12 ENCOUNTER — Ambulatory Visit
Admission: RE | Admit: 2023-08-12 | Discharge: 2023-08-12 | Disposition: A | Discharge status: Home / Self Care | Source: Ambulatory Visit | Attending: Family Medicine | Admitting: Family Medicine

## 2023-08-12 VITALS — BP 118/69 | HR 96 | Temp 98.1°F | Resp 16

## 2023-08-12 DIAGNOSIS — R3989 Other symptoms and signs involving the genitourinary system: Secondary | ICD-10-CM

## 2023-08-12 DIAGNOSIS — E1159 Type 2 diabetes mellitus with other circulatory complications: Secondary | ICD-10-CM | POA: Diagnosis not present

## 2023-08-12 DIAGNOSIS — N3289 Other specified disorders of bladder: Secondary | ICD-10-CM

## 2023-08-12 DIAGNOSIS — R350 Frequency of micturition: Secondary | ICD-10-CM

## 2023-08-12 LAB — POCT URINE DIPSTICK
Bilirubin, UA: NEGATIVE
Blood, UA: NEGATIVE
Glucose, UA: >=1000 mg/dL — AB
Ketones, POC UA: NEGATIVE mg/dL
Leukocytes, UA: NEGATIVE
Nitrite, UA: NEGATIVE
POC PROTEIN,UA: NEGATIVE
Spec Grav, UA: 1.02 (ref 1.010–1.025)
Urobilinogen, UA: 0.2 U/dL
pH, UA: 5.5 (ref 5.0–8.0)

## 2023-08-12 LAB — GLUCOSE, POCT (MANUAL RESULT ENTRY): POC Glucose: 93 mg/dL (ref 70–99)

## 2023-08-12 NOTE — Discharge Instructions (Addendum)
 Your sugar was normal today.  It was also normal.  I suspect that your sharp bladder pains are due to spasming of your bladder muscles as a result of holding increased amounts of urine after increase water intake.  Apply heat to the abdomen and use Tylenol  as needed for pain.  Please discuss symptoms at your follow-up appointment with your primary care provider next week as they may recommend referral to a urologist.  If you develop any new or worsening symptoms or if your symptoms do not start to improve, please return here or follow-up with your primary care provider. If your symptoms are severe, please go to the emergency room.

## 2023-08-12 NOTE — ED Triage Notes (Signed)
 Pt c/o pelvic pain, frequent urination, and chills for 2 weeks.

## 2023-08-12 NOTE — ED Provider Notes (Signed)
 [facility]    CSN: [id number] Arrival date & time: 08/12/23  1230      History   Chief Complaint Chief Complaint  Patient presents with   Abdominal Pain    Pelvic pain and frequent urination, chills - Entered by patient    HPI Courtney Bolton is a 58 y.o. female.   Patient presents today for abdominal pain, urgency and frequency.  Patient states abdominal pain began 2 weeks ago as a dull ache on left side of abdomen. The past two days pain has become sharp and moved to suprapubic area.  Patient endorses urgency and frequency but no burning with urination. Patient states she has been doing a water drinking challenge where she drinks a gallon of water a day.  She states she has reduced her water intake these last two days due to the urinary frequency.  Patient als states her urine has gotten a lot darker over last two days. Patient states she had chills yesterday but no fever.  Hx of diverticulitis but no issues in 10 yrs. Last BM yesterday which was normal. She has not noticed any blood in her stool or urine.   Denies vaginal discharge, vaginal itching, chest pain, shortness of breath, nausea, vomiting, diarrhea, cough, fever, dizziness, back pain. She takes jardiance , she has not had recent dose changes. Sugars are in the 140s at home.    Abdominal Pain   Past Medical History:  Diagnosis Date   Diabetes mellitus    Diabetes type II dx 01/2011   Fibrocystic breast disease    Hypertension    Morbid obesity (HCC)    Peripheral edema     Patient Active Problem List   Diagnosis Date Noted   Pain in right shoulder 06/22/2021   Type II or unspecified type diabetes mellitus without mention of complication, not stated as uncontrolled 07/21/2011   Essential hypertension, benign 01/27/2011   Obesity 01/27/2011   Counseling on health promotion and disease prevention 01/27/2011   Need for prophylactic vaccination and inoculation against influenza 01/27/2011    Past  Surgical History:  Procedure Laterality Date   ABDOMINAL HYSTERECTOMY  2002   Dr. Rockney; fibroids   REDUCTION MAMMAPLASTY      OB History   No obstetric history on file.      Home Medications    Prior to Admission medications   Medication Sig Start Date End Date Taking? Authorizing Provider  amLODipine  (NORVASC ) 10 MG tablet Take one tablet (10 mg dose) by mouth daily. 07/08/21     amoxicillin -clavulanate (AUGMENTIN ) 875-125 MG tablet Take 1 tablet by mouth twice a day for 7 days 08/04/21     atorvastatin  (LIPITOR) 10 MG tablet Take one tablet (10 mg dose) by mouth daily. 07/08/21     atorvastatin  (LIPITOR) 10 MG tablet Take 1 tablet (10 mg total) by mouth daily. 05/07/22     chlorthalidone  (HYGROTON ) 25 MG tablet Take 1 tablet (25 mg total) by mouth daily. 06/16/23     ciprofloxacin  (CIPRO ) 500 MG tablet Take 1 tablet (500 mg total) by mouth 2 (two) times daily. 11/04/12   Parks Sharper, MD  Continuous Blood Gluc Receiver (FREESTYLE LIBRE 2 READER) DEVI Use as directed 07/08/21     Continuous Blood Gluc Sensor (FREESTYLE LIBRE 14 DAY SENSOR) MISC Use to check blood sugar and change every 14 days 09/04/21     Continuous Blood Gluc Sensor (FREESTYLE LIBRE 2 SENSOR) MISC Use 1 sensor every 14 days 07/08/21  Continuous Glucose Sensor (FREESTYLE LIBRE 3 SENSOR) MISC Use to check blood sugar every 14 (fourteen) days. 05/07/22     Continuous Glucose Sensor (FREESTYLE LIBRE 3 SENSOR) MISC Place 1 each onto the skin every 14 (fourteen) days. 12/22/22     Dulaglutide  (TRULICITY ) 3 MG/0.5ML SOPN Inject 3 mg into the skin once a week. 03/10/21     Dulaglutide  (TRULICITY ) 3 MG/0.5ML SOPN Inject 3 mg into the skin once a week. 06/25/21     Dulaglutide  (TRULICITY ) 3 MG/0.5ML SOPN Inject 3 mg into the skin once a week. 07/08/21     Dulaglutide  (TRULICITY ) 3 MG/0.5ML SOPN Inject 3 mg into the skin once a week. 07/27/21     Dulaglutide  (TRULICITY ) 3 MG/0.5ML SOPN Inject 3 mg into the skin once a week. 08/20/21      Dulaglutide  (TRULICITY ) 3 MG/0.5ML SOPN Inject 3 mg into the skin once a week. 09/16/21     Dulaglutide  (TRULICITY ) 3 MG/0.5ML SOPN Inject 3 mg into the skin once a week. 10/28/21     Dulaglutide  (TRULICITY ) 3 MG/0.5ML SOPN Inject 3 mg into the skin every 7 (seven) days. 05/07/22     Dulaglutide  (TRULICITY ) 4.5 MG/0.5ML SOAJ Inject 4.5 mg into the skin once a week. 08/20/22     Dulaglutide  (TRULICITY ) 4.5 MG/0.5ML SOAJ Inject 4.5 mg into the skin once a week. 09/22/22     empagliflozin  (JARDIANCE ) 10 MG TABS tablet Take one tablet (10 mg dose) by mouth daily. 07/08/21     empagliflozin  (JARDIANCE ) 10 MG TABS tablet Take 1 tablet (10 mg total) by mouth daily. 08/07/21     empagliflozin  (JARDIANCE ) 10 MG TABS tablet Take 1 tablet (10 mg total) by mouth daily. 04/22/22     empagliflozin  (JARDIANCE ) 10 MG TABS tablet Take 1 tablet (10 mg total) by mouth daily. 04/21/23     hydrALAZINE  (APRESOLINE ) 25 MG tablet Take 1 tablet (25 mg total) by mouth 2 (two) times daily. 06/17/22     hydrALAZINE  (APRESOLINE ) 50 MG tablet Take 1 tablet (50 mg total) by mouth 3 (three) times daily. 03/15/23     hydrochlorothiazide  (HYDRODIURIL ) 25 MG tablet TAKE 1 TABLET (25 MG TOTAL) BY MOUTH DAILY. 05/08/12   Tysinger, Alm RAMAN, PA-C  hydrochlorothiazide  (HYDRODIURIL ) 25 MG tablet TAKE 1 TABLET (25 MG TOTAL) BY MOUTH DAILY. 06/03/12   Tysinger, Alm RAMAN, PA-C  hydrochlorothiazide  (HYDRODIURIL ) 25 MG tablet Take one tablet (25 mg dose) by mouth daily. 07/08/21     hydrochlorothiazide  (HYDRODIURIL ) 25 MG tablet Take 1 tablet (25 mg total) by mouth daily. 08/07/21     hydrochlorothiazide  (HYDRODIURIL ) 25 MG tablet Take 1 tablet (25 mg total) by mouth daily. 04/22/22     hydrochlorothiazide  (HYDRODIURIL ) 25 MG tablet Take 1 tablet (25 mg total) by mouth daily. 05/07/22     HYDROcodone -acetaminophen  (NORCO/VICODIN) 5-325 MG per tablet 1-2 tablets po q 6 hours prn moderate to severe pain 11/04/12   Ghim, Michael, MD  metFORMIN  (GLUCOPHAGE ) 500  MG tablet Take 1 tablet (500 mg total) by mouth 2 (two) times daily with a meal. 05/08/12   Tysinger, Alm RAMAN, PA-C  metFORMIN  (GLUCOPHAGE ) 500 MG tablet Take two tablets (1,000 mg dose) by mouth at bedtime. 07/08/21     metFORMIN  (GLUCOPHAGE ) 500 MG tablet Take 2 tablets (1,000 mg total) by mouth at bedtime. 04/22/22     metFORMIN  (GLUCOPHAGE ) 500 MG tablet Take 2 tablets (1,000 mg total) by mouth daily with supper. 04/21/23     metroNIDAZOLE  (FLAGYL ) 500 MG tablet  Take 1 tablet (500 mg total) by mouth 2 (two) times daily. 11/04/12   Parks Sharper, MD  predniSONE  (DELTASONE ) 10 MG tablet Take 6 tablets by mouth daily for 1 day then take 5 tabs for 1 day then 4 tabs for 1 day 3 tabs for 1 day 2 tabs for 1 day and 1 tab for 1 day 08/04/21     promethazine  (PHENERGAN ) 25 MG tablet Take 1 tablet (25 mg total) by mouth every 6 (six) hours as needed for nausea. 11/04/12   Parks Sharper, MD  lisinopril -hydrochlorothiazide  (PRINZIDE ,ZESTORETIC ) 20-25 MG per tablet Take 1 tablet by mouth daily. 01/27/11 06/20/11  Tysinger, Alm RAMAN, PA-C    Family History Family History  Problem Relation Age of Onset   Diabetes Mother    Hypertension Mother    Diabetes Sister    Hypertension Sister    Cancer Maternal Grandmother        luekemia   Heart disease Maternal Grandfather    Stroke Neg Hx     Social History Social History   Tobacco Use   Smoking status: Never  Substance Use Topics   Alcohol use: No   Drug use: No     Allergies   Valsartan, Lisinopril , and Sulfa antibiotics   Review of Systems Review of Systems  Gastrointestinal:  Positive for abdominal pain.  Per HPI   Physical Exam Triage Vital Signs ED Triage Vitals [08/12/23 1309]  Encounter Vitals Group     BP 118/69     Girls Systolic BP Percentile      Girls Diastolic BP Percentile      Boys Systolic BP Percentile      Boys Diastolic BP Percentile      Pulse Rate 96     Resp 16     Temp 98.1 F (36.7 C)     Temp Source Oral      SpO2 92 %     Weight      Height      Head Circumference      Peak Flow      Pain Score 3     Pain Loc      Pain Education      Exclude from Growth Chart    No data found.  Updated Vital Signs BP 118/69 (BP Location: Right Arm)   Pulse 96   Temp 98.1 F (36.7 C) (Oral)   Resp 16   SpO2 92%   Visual Acuity Right Eye Distance:   Left Eye Distance:   Bilateral Distance:    Right Eye Near:   Left Eye Near:    Bilateral Near:     Physical Exam Vitals and nursing note reviewed.  Constitutional:      Appearance: She is not ill-appearing or toxic-appearing.  HENT:     Head: Normocephalic and atraumatic.     Right Ear: Hearing and external ear normal.     Left Ear: Hearing and external ear normal.     Nose: Nose normal.     Mouth/Throat:     Lips: Pink.     Mouth: Mucous membranes are moist.  Eyes:     General: Lids are normal. Vision grossly intact. Gaze aligned appropriately.     Extraocular Movements: Extraocular movements intact.     Conjunctiva/sclera: Conjunctivae normal.  Cardiovascular:     Rate and Rhythm: Normal rate and regular rhythm.     Heart sounds: Normal heart sounds, S1 normal and S2 normal.  Pulmonary:  Effort: Pulmonary effort is normal. No respiratory distress.     Breath sounds: Normal breath sounds and air entry.  Abdominal:     General: Bowel sounds are normal.     Palpations: Abdomen is soft.     Tenderness: There is abdominal tenderness in the suprapubic area. There is no right CVA tenderness, left CVA tenderness or guarding.  Musculoskeletal:     Cervical back: Neck supple.  Skin:    General: Skin is warm and dry.     Capillary Refill: Capillary refill takes less than 2 seconds.     Findings: No rash.  Neurological:     General: No focal deficit present.     Mental Status: She is alert and oriented to person, place, and time. Mental status is at baseline.     Cranial Nerves: No dysarthria or facial asymmetry.  Psychiatric:         Mood and Affect: Mood normal.        Speech: Speech normal.        Behavior: Behavior normal.        Thought Content: Thought content normal.        Judgment: Judgment normal.      UC Treatments / Results  Labs (all labs ordered are listed, but only abnormal results are displayed) Labs Reviewed  POCT URINE DIPSTICK - Abnormal; Notable for the following components:      Result Value   Glucose, UA >=1,000 (*)    All other components within normal limits  GLUCOSE, POCT (MANUAL RESULT ENTRY)    EKG   Radiology No results found.  Procedures Procedures (including critical care time)  Medications Ordered in UC Medications - No data to display  Initial Impression / Assessment and Plan / UC Course  I have reviewed the triage vital signs and the nursing notes.  Pertinent labs & imaging results that were available during my care of the patient were reviewed by me and considered in my medical decision making (see chart for details).   1. 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 Prior to water challenge start 2-3 weeks ago, she was drinking 4-5 glasses of water per day, she admits this was a significant sudden increase in water intake.   CBG 93, low suspicion for DKA/worsening diabetes.   Urinalysis shows glycosuria (expected due to SGLT-2 inhibitor) without other signs of UTI.   Low suspicion for acute vaginitis causes of abdominal pain.   Recommend decreasing water intake to 8-9 glasses of water per day to maintain adequate hydration. Follow-up with PCP who may recommend referral to urology if symptoms persist.   Counseled patient on potential for adverse effects with medications prescribed/recommended today, strict ER and return-to-clinic precautions discussed, patient verbalized understanding.    Final Clinical Impressions(s) / UC Diagnoses    Final diagnoses:  Urinary frequency  Painful bladder spasm  Type 2 diabetes mellitus with other circulatory complication, without long-term current use of insulin [facility])     Discharge Instructions      Your sugar was normal today.  It was also normal.  I suspect that your sharp bladder pains are due to spasming of your bladder muscles as a result of holding increased amounts of urine after increase water intake.  Apply heat to the abdomen and use Tylenol  as needed for pain.  Please discuss symptoms at your follow-up appointment with your primary care provider next week  as they may recommend referral to a urologist.  If you develop any new or worsening symptoms or if your symptoms do not start to improve, please return here or follow-up with your primary care provider. If your symptoms are severe, please go to the emergency room.     ED Prescriptions   None    PDMP not reviewed this encounter.   Enedelia Dorna HERO, [place] 08/12/23 2212

## 2023-08-12 NOTE — Medical Student Note (Signed)
 [facility] Provider Student Note For educational purposes for Medical, PA and NP students only and not part of the legal medical record.   CSN: [id number] Arrival date & time: 08/12/23  1230      History   Chief Complaint Chief Complaint  Patient presents with   Abdominal Pain    Pelvic pain and frequent urination, chills - Entered by patient    HPI Courtney Bolton is a 58 y.o. female.  Patient presents today for abdominal pain, urgency and frequency.  Patient states abdominal pain began 2 weeks ago as a dull ache on left side of abdomen. The past two days pain has become sharp and moved to suprapubic area.  Patient endorses urgency and frequency but no burning with urination. Patient states she has been doing a water drinking challenge where she drinks a gallon of water a day.  She states she has reduced her water intake these last two days due to the urinary frequency.  Patient als states her urine has gotten a lot darker over last two days. Patient states she had chills yesterday but no fever.  Hx of diverticulitis but no issues in 10 yrs. Last BM yesterday which was normal. She has not noticed any blood in her stool or urine.   Denies vaginal discharge, vaginal itching, chest pain, shortness of breath, nausea, vomiting, diarrhea, cough, fever, abdominal pain, back pain.     Abdominal Pain   Past Medical History:  Diagnosis Date   Diabetes mellitus    Diabetes type II dx 01/2011   Fibrocystic breast disease    Hypertension    Morbid obesity (HCC)    Peripheral edema     Patient Active Problem List   Diagnosis Date Noted   Pain in right shoulder 06/22/2021   Type II or unspecified type diabetes mellitus without mention of complication, not stated as uncontrolled 07/21/2011   Essential hypertension, benign 01/27/2011   Obesity 01/27/2011   Counseling on health promotion and disease prevention 01/27/2011   Need for prophylactic vaccination and  inoculation against influenza 01/27/2011    Past Surgical History:  Procedure Laterality Date   ABDOMINAL HYSTERECTOMY  2002   Dr. Rockney; fibroids   REDUCTION MAMMAPLASTY      OB History   No obstetric history on file.      Home Medications    Prior to Admission medications   Medication Sig Start Date End Date Taking? Authorizing Provider  amLODipine  (NORVASC ) 10 MG tablet Take one tablet (10 mg dose) by mouth daily. 07/08/21     amoxicillin -clavulanate (AUGMENTIN ) 875-125 MG tablet Take 1 tablet by mouth twice a day for 7 days 08/04/21     atorvastatin  (LIPITOR) 10 MG tablet Take one tablet (10 mg dose) by mouth daily. 07/08/21     atorvastatin  (LIPITOR) 10 MG tablet Take 1 tablet (10 mg total) by mouth daily. 05/07/22     chlorthalidone  (HYGROTON ) 25 MG tablet Take 1 tablet (25 mg total) by mouth daily. 06/16/23     ciprofloxacin  (CIPRO ) 500 MG tablet Take 1 tablet (500 mg total) by mouth 2 (two) times daily. 11/04/12   Parks Sharper, MD  Continuous Blood Gluc Receiver (FREESTYLE LIBRE 2 READER) DEVI Use as directed 07/08/21     Continuous Blood Gluc Sensor (FREESTYLE LIBRE 14 DAY SENSOR) MISC Use to check blood sugar and change every 14 days 09/04/21     Continuous Blood Gluc Sensor (FREESTYLE LIBRE 2 SENSOR) MISC Use 1 sensor every 14  days 07/08/21     Continuous Glucose Sensor (FREESTYLE LIBRE 3 SENSOR) MISC Use to check blood sugar every 14 (fourteen) days. 05/07/22     Continuous Glucose Sensor (FREESTYLE LIBRE 3 SENSOR) MISC Place 1 each onto the skin every 14 (fourteen) days. 12/22/22     Dulaglutide  (TRULICITY ) 3 MG/0.5ML SOPN Inject 3 mg into the skin once a week. 03/10/21     Dulaglutide  (TRULICITY ) 3 MG/0.5ML SOPN Inject 3 mg into the skin once a week. 06/25/21     Dulaglutide  (TRULICITY ) 3 MG/0.5ML SOPN Inject 3 mg into the skin once a week. 07/08/21     Dulaglutide  (TRULICITY ) 3 MG/0.5ML SOPN Inject 3 mg into the skin once a week. 07/27/21     Dulaglutide  (TRULICITY ) 3 MG/0.5ML  SOPN Inject 3 mg into the skin once a week. 08/20/21     Dulaglutide  (TRULICITY ) 3 MG/0.5ML SOPN Inject 3 mg into the skin once a week. 09/16/21     Dulaglutide  (TRULICITY ) 3 MG/0.5ML SOPN Inject 3 mg into the skin once a week. 10/28/21     Dulaglutide  (TRULICITY ) 3 MG/0.5ML SOPN Inject 3 mg into the skin every 7 (seven) days. 05/07/22     Dulaglutide  (TRULICITY ) 4.5 MG/0.5ML SOAJ Inject 4.5 mg into the skin once a week. 08/20/22     Dulaglutide  (TRULICITY ) 4.5 MG/0.5ML SOAJ Inject 4.5 mg into the skin once a week. 09/22/22     empagliflozin  (JARDIANCE ) 10 MG TABS tablet Take one tablet (10 mg dose) by mouth daily. 07/08/21     empagliflozin  (JARDIANCE ) 10 MG TABS tablet Take 1 tablet (10 mg total) by mouth daily. 08/07/21     empagliflozin  (JARDIANCE ) 10 MG TABS tablet Take 1 tablet (10 mg total) by mouth daily. 04/22/22     empagliflozin  (JARDIANCE ) 10 MG TABS tablet Take 1 tablet (10 mg total) by mouth daily. 04/21/23     hydrALAZINE  (APRESOLINE ) 25 MG tablet Take 1 tablet (25 mg total) by mouth 2 (two) times daily. 06/17/22     hydrALAZINE  (APRESOLINE ) 50 MG tablet Take 1 tablet (50 mg total) by mouth 3 (three) times daily. 03/15/23     hydrochlorothiazide  (HYDRODIURIL ) 25 MG tablet TAKE 1 TABLET (25 MG TOTAL) BY MOUTH DAILY. 05/08/12   Tysinger, Alm RAMAN, PA-C  hydrochlorothiazide  (HYDRODIURIL ) 25 MG tablet TAKE 1 TABLET (25 MG TOTAL) BY MOUTH DAILY. 06/03/12   Tysinger, Alm RAMAN, PA-C  hydrochlorothiazide  (HYDRODIURIL ) 25 MG tablet Take one tablet (25 mg dose) by mouth daily. 07/08/21     hydrochlorothiazide  (HYDRODIURIL ) 25 MG tablet Take 1 tablet (25 mg total) by mouth daily. 08/07/21     hydrochlorothiazide  (HYDRODIURIL ) 25 MG tablet Take 1 tablet (25 mg total) by mouth daily. 04/22/22     hydrochlorothiazide  (HYDRODIURIL ) 25 MG tablet Take 1 tablet (25 mg total) by mouth daily. 05/07/22     HYDROcodone -acetaminophen  (NORCO/VICODIN) 5-325 MG per tablet 1-2 tablets po q 6 hours prn moderate to severe pain  11/04/12   Ghim, Michael, MD  metFORMIN  (GLUCOPHAGE ) 500 MG tablet Take 1 tablet (500 mg total) by mouth 2 (two) times daily with a meal. 05/08/12   Tysinger, Alm RAMAN, PA-C  metFORMIN  (GLUCOPHAGE ) 500 MG tablet Take two tablets (1,000 mg dose) by mouth at bedtime. 07/08/21     metFORMIN  (GLUCOPHAGE ) 500 MG tablet Take 2 tablets (1,000 mg total) by mouth at bedtime. 04/22/22     metFORMIN  (GLUCOPHAGE ) 500 MG tablet Take 2 tablets (1,000 mg total) by mouth daily with supper. 04/21/23  metroNIDAZOLE  (FLAGYL ) 500 MG tablet Take 1 tablet (500 mg total) by mouth 2 (two) times daily. 11/04/12   Parks Sharper, MD  predniSONE  (DELTASONE ) 10 MG tablet Take 6 tablets by mouth daily for 1 day then take 5 tabs for 1 day then 4 tabs for 1 day 3 tabs for 1 day 2 tabs for 1 day and 1 tab for 1 day 08/04/21     promethazine  (PHENERGAN ) 25 MG tablet Take 1 tablet (25 mg total) by mouth every 6 (six) hours as needed for nausea. 11/04/12   Parks Sharper, MD  lisinopril -hydrochlorothiazide  (PRINZIDE ,ZESTORETIC ) 20-25 MG per tablet Take 1 tablet by mouth daily. 01/27/11 06/20/11  Tysinger, Alm RAMAN, PA-C    Family History Family History  Problem Relation Age of Onset   Diabetes Mother    Hypertension Mother    Diabetes Sister    Hypertension Sister    Cancer Maternal Grandmother        luekemia   Heart disease Maternal Grandfather    Stroke Neg Hx     Social History Social History   Tobacco Use   Smoking status: Never  Substance Use Topics   Alcohol use: No   Drug use: No     Allergies   Valsartan, Lisinopril , and Sulfa antibiotics   Review of Systems Review of Systems  Gastrointestinal:  Positive for abdominal pain.   PER HPI  Physical Exam Updated Vital Signs BP 118/69 (BP Location: Right Arm)   Pulse 96   Temp 98.1 F (36.7 C) (Oral)   Resp 16   SpO2 92%   Physical Exam Vitals and nursing note reviewed.  Constitutional:      General: She is not in acute distress.    Appearance:  Normal appearance. She is well-developed.  HENT:     Head: Normocephalic and atraumatic.     Right Ear: Tympanic membrane normal.     Left Ear: Tympanic membrane normal.     Mouth/Throat:     Mouth: Mucous membranes are moist.  Eyes:     Conjunctiva/sclera: Conjunctivae normal.     Pupils: Pupils are equal, round, and reactive to light.  Cardiovascular:     Rate and Rhythm: Normal rate and regular rhythm.     Pulses: Normal pulses.     Heart sounds: Normal heart sounds. No murmur heard. Pulmonary:     Effort: Pulmonary effort is normal. No respiratory distress.     Breath sounds: Normal breath sounds.  Abdominal:     Palpations: Abdomen is soft.     Tenderness: There is abdominal tenderness in the suprapubic area. There is no guarding. Negative signs include Murphy's sign and McBurney's sign.  Musculoskeletal:        General: No swelling.     Cervical back: Neck supple.  Skin:    General: Skin is warm and dry.     Capillary Refill: Capillary refill takes less than 2 seconds.  Neurological:     Mental Status: She is alert.  Psychiatric:        Mood and Affect: Mood normal.        Behavior: Behavior normal.      ED Treatments / Results  Labs (all labs ordered are listed, but only abnormal results are displayed) Labs Reviewed  POCT URINE DIPSTICK - Abnormal; Notable for the following components:      Result Value   Glucose, UA >=1,000 (*)    All other components within normal limits    EKG  Radiology  No results found.  Procedures Procedures (including critical care time)  Medications Ordered in ED Medications - No data to display   Initial Impression / Assessment and Plan / ED Course  I have reviewed the triage vital signs and the nursing notes.  Pertinent labs & imaging results that were available during my care of the patient were reviewed by me and considered in my medical decision making (see chart for details).   1. Urinary symptoms and bladder  pain Symptoms and HPI are suspicious for bladder spasms UA negative for UTI.  Low suspicious for vaginitis.  CBG today 93 Reduce water intake, try pelvic floor exercises and heat to the abdomen for symptom relief.  Follow up with primary care doctor  May need referral to Urology in the future if symptoms persist.     Final Clinical Impressions(s) / ED Diagnoses   Final diagnoses:  None    New Prescriptions New Prescriptions   No medications on file

## 2023-09-19 ENCOUNTER — Other Ambulatory Visit: Payer: Self-pay

## 2023-09-19 ENCOUNTER — Other Ambulatory Visit (HOSPITAL_BASED_OUTPATIENT_CLINIC_OR_DEPARTMENT_OTHER): Payer: Self-pay

## 2023-09-19 MED ORDER — HYDRALAZINE HCL 50 MG PO TABS
50.0000 mg | ORAL_TABLET | Freq: Three times a day (TID) | ORAL | 0 refills | Status: DC
  Filled 2023-09-19: qty 90, 30d supply, fill #0

## 2023-09-19 MED ORDER — JARDIANCE 10 MG PO TABS
10.0000 mg | ORAL_TABLET | Freq: Every day | ORAL | 0 refills | Status: DC
  Filled 2023-09-19 – 2023-10-03 (×3): qty 30, 30d supply, fill #0

## 2023-09-20 ENCOUNTER — Other Ambulatory Visit (HOSPITAL_BASED_OUTPATIENT_CLINIC_OR_DEPARTMENT_OTHER): Payer: Self-pay

## 2023-09-21 ENCOUNTER — Other Ambulatory Visit (HOSPITAL_BASED_OUTPATIENT_CLINIC_OR_DEPARTMENT_OTHER): Payer: Self-pay

## 2023-09-22 ENCOUNTER — Other Ambulatory Visit (HOSPITAL_BASED_OUTPATIENT_CLINIC_OR_DEPARTMENT_OTHER): Payer: Self-pay

## 2023-09-22 MED ORDER — CHLORTHALIDONE 25 MG PO TABS
25.0000 mg | ORAL_TABLET | Freq: Every day | ORAL | 0 refills | Status: DC
  Filled 2023-09-22: qty 90, 90d supply, fill #0

## 2023-10-03 ENCOUNTER — Other Ambulatory Visit (HOSPITAL_BASED_OUTPATIENT_CLINIC_OR_DEPARTMENT_OTHER): Payer: Self-pay

## 2023-10-10 ENCOUNTER — Other Ambulatory Visit (HOSPITAL_BASED_OUTPATIENT_CLINIC_OR_DEPARTMENT_OTHER): Payer: Self-pay

## 2023-10-10 MED ORDER — MELOXICAM 15 MG PO TABS
15.0000 mg | ORAL_TABLET | Freq: Every day | ORAL | 0 refills | Status: AC
  Filled 2023-10-10: qty 15, 15d supply, fill #0

## 2023-10-11 ENCOUNTER — Other Ambulatory Visit: Payer: Self-pay

## 2023-10-11 ENCOUNTER — Other Ambulatory Visit (HOSPITAL_BASED_OUTPATIENT_CLINIC_OR_DEPARTMENT_OTHER): Payer: Self-pay

## 2023-10-11 MED ORDER — CHLORTHALIDONE 25 MG PO TABS
25.0000 mg | ORAL_TABLET | Freq: Every day | ORAL | 3 refills | Status: AC
  Filled 2023-12-05: qty 90, 90d supply, fill #0

## 2023-10-11 MED ORDER — HYDRALAZINE HCL 50 MG PO TABS
50.0000 mg | ORAL_TABLET | Freq: Three times a day (TID) | ORAL | 3 refills | Status: AC
  Filled 2023-10-11 – 2023-10-12 (×2): qty 90, 30d supply, fill #0
  Filled 2023-11-15: qty 90, 30d supply, fill #1
  Filled 2024-01-01: qty 90, 30d supply, fill #2
  Filled 2024-02-06: qty 90, 30d supply, fill #3

## 2023-10-11 MED ORDER — METFORMIN HCL 500 MG PO TABS
1000.0000 mg | ORAL_TABLET | Freq: Every day | ORAL | 3 refills | Status: AC
  Filled 2023-10-11: qty 180, 90d supply, fill #0
  Filled 2024-01-07: qty 180, 90d supply, fill #1

## 2023-10-11 MED ORDER — ATORVASTATIN CALCIUM 10 MG PO TABS
10.0000 mg | ORAL_TABLET | Freq: Every day | ORAL | 3 refills | Status: AC
  Filled 2023-10-11: qty 90, 90d supply, fill #0

## 2023-10-11 MED ORDER — JARDIANCE 10 MG PO TABS
10.0000 mg | ORAL_TABLET | Freq: Every day | ORAL | 3 refills | Status: AC
  Filled 2023-11-30: qty 90, 90d supply, fill #0

## 2023-10-12 ENCOUNTER — Other Ambulatory Visit (HOSPITAL_BASED_OUTPATIENT_CLINIC_OR_DEPARTMENT_OTHER): Payer: Self-pay

## 2023-10-19 ENCOUNTER — Other Ambulatory Visit (HOSPITAL_BASED_OUTPATIENT_CLINIC_OR_DEPARTMENT_OTHER): Payer: Self-pay

## 2023-10-25 ENCOUNTER — Other Ambulatory Visit (HOSPITAL_BASED_OUTPATIENT_CLINIC_OR_DEPARTMENT_OTHER): Payer: Self-pay

## 2023-10-25 MED ORDER — TRULICITY 4.5 MG/0.5ML ~~LOC~~ SOAJ
4.5000 mg | SUBCUTANEOUS | 0 refills | Status: AC
  Filled 2023-10-25: qty 2, 28d supply, fill #0

## 2023-10-28 ENCOUNTER — Other Ambulatory Visit (HOSPITAL_BASED_OUTPATIENT_CLINIC_OR_DEPARTMENT_OTHER): Payer: Self-pay

## 2023-10-28 MED ORDER — TRULICITY 4.5 MG/0.5ML ~~LOC~~ SOAJ
4.5000 mg | SUBCUTANEOUS | 1 refills | Status: DC
  Filled 2023-10-28: qty 6, 84d supply, fill #0

## 2023-11-01 ENCOUNTER — Other Ambulatory Visit (HOSPITAL_BASED_OUTPATIENT_CLINIC_OR_DEPARTMENT_OTHER): Payer: Self-pay

## 2023-11-01 MED ORDER — OZEMPIC (0.25 OR 0.5 MG/DOSE) 2 MG/3ML ~~LOC~~ SOPN
0.2500 mg | PEN_INJECTOR | SUBCUTANEOUS | 1 refills | Status: AC
  Filled 2023-11-01: qty 3, 56d supply, fill #0

## 2023-11-03 ENCOUNTER — Other Ambulatory Visit (HOSPITAL_BASED_OUTPATIENT_CLINIC_OR_DEPARTMENT_OTHER): Payer: Self-pay

## 2023-11-04 ENCOUNTER — Other Ambulatory Visit (HOSPITAL_BASED_OUTPATIENT_CLINIC_OR_DEPARTMENT_OTHER): Payer: Self-pay

## 2023-11-07 ENCOUNTER — Other Ambulatory Visit (HOSPITAL_BASED_OUTPATIENT_CLINIC_OR_DEPARTMENT_OTHER): Payer: Self-pay

## 2023-11-08 ENCOUNTER — Other Ambulatory Visit (HOSPITAL_BASED_OUTPATIENT_CLINIC_OR_DEPARTMENT_OTHER): Payer: Self-pay

## 2023-11-09 ENCOUNTER — Other Ambulatory Visit (HOSPITAL_BASED_OUTPATIENT_CLINIC_OR_DEPARTMENT_OTHER): Payer: Self-pay

## 2023-11-10 ENCOUNTER — Other Ambulatory Visit (HOSPITAL_BASED_OUTPATIENT_CLINIC_OR_DEPARTMENT_OTHER): Payer: Self-pay

## 2023-11-11 ENCOUNTER — Other Ambulatory Visit (HOSPITAL_BASED_OUTPATIENT_CLINIC_OR_DEPARTMENT_OTHER): Payer: Self-pay

## 2023-11-14 ENCOUNTER — Other Ambulatory Visit (HOSPITAL_BASED_OUTPATIENT_CLINIC_OR_DEPARTMENT_OTHER): Payer: Self-pay

## 2023-11-15 ENCOUNTER — Other Ambulatory Visit (HOSPITAL_BASED_OUTPATIENT_CLINIC_OR_DEPARTMENT_OTHER): Payer: Self-pay

## 2023-11-30 ENCOUNTER — Other Ambulatory Visit (HOSPITAL_BASED_OUTPATIENT_CLINIC_OR_DEPARTMENT_OTHER): Payer: Self-pay

## 2023-11-30 ENCOUNTER — Other Ambulatory Visit: Payer: Self-pay

## 2023-12-05 ENCOUNTER — Other Ambulatory Visit (HOSPITAL_BASED_OUTPATIENT_CLINIC_OR_DEPARTMENT_OTHER): Payer: Self-pay

## 2023-12-05 MED ORDER — OZEMPIC (2 MG/DOSE) 8 MG/3ML ~~LOC~~ SOPN
2.0000 mg | PEN_INJECTOR | SUBCUTANEOUS | 2 refills | Status: AC
  Filled 2023-12-05: qty 3, 30d supply, fill #0
  Filled 2024-01-01 – 2024-01-03 (×2): qty 3, 30d supply, fill #1
  Filled 2024-01-11: qty 3, 28d supply, fill #1
  Filled 2024-02-06: qty 3, 28d supply, fill #2

## 2023-12-06 ENCOUNTER — Other Ambulatory Visit (HOSPITAL_BASED_OUTPATIENT_CLINIC_OR_DEPARTMENT_OTHER): Payer: Self-pay

## 2024-01-02 ENCOUNTER — Other Ambulatory Visit (HOSPITAL_BASED_OUTPATIENT_CLINIC_OR_DEPARTMENT_OTHER): Payer: Self-pay

## 2024-01-03 ENCOUNTER — Other Ambulatory Visit (HOSPITAL_BASED_OUTPATIENT_CLINIC_OR_DEPARTMENT_OTHER): Payer: Self-pay

## 2024-01-04 ENCOUNTER — Other Ambulatory Visit (HOSPITAL_BASED_OUTPATIENT_CLINIC_OR_DEPARTMENT_OTHER): Payer: Self-pay

## 2024-01-06 ENCOUNTER — Other Ambulatory Visit (HOSPITAL_BASED_OUTPATIENT_CLINIC_OR_DEPARTMENT_OTHER): Payer: Self-pay

## 2024-01-10 ENCOUNTER — Other Ambulatory Visit (HOSPITAL_BASED_OUTPATIENT_CLINIC_OR_DEPARTMENT_OTHER): Payer: Self-pay

## 2024-01-11 ENCOUNTER — Other Ambulatory Visit (HOSPITAL_BASED_OUTPATIENT_CLINIC_OR_DEPARTMENT_OTHER): Payer: Self-pay

## 2024-01-30 ENCOUNTER — Other Ambulatory Visit (INDEPENDENT_AMBULATORY_CARE_PROVIDER_SITE_OTHER): Payer: Self-pay

## 2024-01-30 ENCOUNTER — Ambulatory Visit: Admitting: Orthopedic Surgery

## 2024-01-30 ENCOUNTER — Other Ambulatory Visit: Payer: Self-pay

## 2024-01-30 DIAGNOSIS — M19011 Primary osteoarthritis, right shoulder: Secondary | ICD-10-CM | POA: Diagnosis not present

## 2024-01-31 ENCOUNTER — Encounter: Payer: Self-pay | Admitting: Orthopedic Surgery

## 2024-01-31 MED ORDER — TRIAMCINOLONE ACETONIDE 40 MG/ML IJ SUSP
40.0000 mg | INTRAMUSCULAR | Status: AC | PRN
  Administered 2024-01-30: 40 mg via INTRA_ARTICULAR

## 2024-01-31 MED ORDER — LIDOCAINE HCL 1 % IJ SOLN
5.0000 mL | INTRAMUSCULAR | Status: AC | PRN
  Administered 2024-01-30: 5 mL

## 2024-01-31 MED ORDER — BUPIVACAINE HCL 0.5 % IJ SOLN
9.0000 mL | INTRAMUSCULAR | Status: AC | PRN
  Administered 2024-01-30: 9 mL via INTRA_ARTICULAR

## 2024-01-31 NOTE — Progress Notes (Signed)
 "  Office Visit Note   Patient: Courtney Bolton           Date of Birth: 02-09-1965           MRN: [id number] Visit Date: 01/30/2024 Requested by: Courtney Erminio CROME, MD [address] [place],  [place] [address] PCP: Courtney Erminio CROME, MD  Subjective: Chief Complaint  Patient presents with   Right Shoulder - Pain    HPI: Courtney Bolton is a 59 y.o. female who presents to the office reporting right shoulder pain.  Has known right shoulder and operable rotator cuff tearing.  Last seen about 2 years ago.  Had an injection in November 23 which gave her good relief.  She is functional with the shoulder.  Does not wake her from sleep at night.  Radiates down to her hand.  Does report some popping in the shoulder..                ROS: All systems reviewed are negative as they relate to the chief complaint within the history of present illness.  Patient denies fevers or chills.  Assessment & Plan: Visit Diagnoses:  1. Arthritis of right shoulder region     Plan: Impression is early rotator cuff arthropathy in the right shoulder with maintenance of active forward flexion and abduction above 90 degrees.  Plan is glenohumeral joint injection performed under ultrasound guidance today.  She will follow-up with us  as needed.  Follow-Up Instructions: No follow-ups on file.   Orders:  Orders Placed This Encounter  Procedures   XR Shoulder Right   US  Guided Needle Placement - No Linked Charges   No orders of the defined types were placed in this encounter.     Procedures: Large Joint Inj: R glenohumeral on 01/30/2024 10:38 AM Indications: diagnostic evaluation and pain Details: 22 G 3.5 in needle, ultrasound-guided posterior approach  Arthrogram: No  Medications: 9 mL bupivacaine  0.5 %; 5 mL lidocaine  1 %; 40 mg triamcinolone  acetonide 40 MG/ML Outcome: tolerated well, no immediate complications Procedure, treatment alternatives, risks and benefits  explained, specific risks discussed. Consent was given by the patient. Immediately prior to procedure a time out was called to verify the correct patient, procedure, equipment, support staff and site/side marked as required. Patient was prepped and draped in the usual sterile fashion.       Clinical Data: No additional findings.  Objective: Vital Signs: There were no vitals taken for this visit.  Physical Exam:  Constitutional: Patient appears well-developed HEENT:  Head: Normocephalic Eyes:EOM are normal Neck: Normal range of motion Cardiovascular: Normal rate Pulmonary/chest: Effort normal Neurologic: Patient is alert Skin: Skin is warm Psychiatric: Patient has normal mood and affect  Ortho Exam: Ortho exam demonstrates active forward flexion and abduction above 90 degrees.  She does have some weakness to external rotation on the right not the left.  Subscap strength is 5 out of 5.  Coarseness consistent with known posterior superior rotator cuff tearing is present with passive range of motion of the right arm.  Deltoid is functional.  Specialty Comments:  No specialty comments available.  Imaging: XR Shoulder Right Result Date: 01/31/2024 AP lateral merchant radiographs right shoulder reviewed.  Acromiohumeral distance maintained.  No fracture or dislocation.  Mild degenerative changes in the [facility] joint.  Visualized lung fields clear.  US  Guided Needle Placement - No Linked Charges Result Date: 01/31/2024 Ultrasound imaging demonstrates needle placement into the [facility] joint with injection of fluid  into the joint and no complicating features     PMFS History: Patient Active Problem List   Diagnosis Date Noted   Pain in right shoulder 06/22/2021   Type II or unspecified type diabetes mellitus without mention of complication, not stated as uncontrolled 07/21/2011   Essential hypertension, benign 01/27/2011   Obesity 01/27/2011   Counseling on health promotion and disease  prevention 01/27/2011   Need for prophylactic vaccination and inoculation against influenza 01/27/2011   Past Medical History:  Diagnosis Date   Diabetes mellitus    Diabetes type II dx 01/2011   Fibrocystic breast disease    Hypertension    Morbid obesity (HCC)    Peripheral edema     Family History  Problem Relation Age of Onset   Diabetes Mother    Hypertension Mother    Diabetes Sister    Hypertension Sister    Cancer Maternal Grandmother        luekemia   Heart disease Maternal Grandfather    Stroke Neg Hx     Past Surgical History:  Procedure Laterality Date   ABDOMINAL HYSTERECTOMY  2002   Dr. Rockney; fibroids   REDUCTION MAMMAPLASTY     Social History   Occupational History   Not on file  Tobacco Use   Smoking status: Never   Smokeless tobacco: Not on file  Substance and Sexual Activity   Alcohol use: No   Drug use: No   Sexual activity: Not on file        "

## 2024-02-13 ENCOUNTER — Other Ambulatory Visit (HOSPITAL_BASED_OUTPATIENT_CLINIC_OR_DEPARTMENT_OTHER): Payer: Self-pay

## 2024-02-13 MED ORDER — AZELASTINE HCL 0.1 % NA SOLN
1.0000 | Freq: Two times a day (BID) | NASAL | 0 refills | Status: AC
  Filled 2024-02-13: qty 30, 50d supply, fill #0

## 2024-02-13 MED ORDER — FLUTICASONE PROPIONATE 50 MCG/ACT NA SUSP
1.0000 | Freq: Every day | NASAL | 0 refills | Status: AC
  Filled 2024-02-13: qty 16, 30d supply, fill #0

## 2024-02-13 MED ORDER — BENZONATATE 200 MG PO CAPS
200.0000 mg | ORAL_CAPSULE | Freq: Three times a day (TID) | ORAL | 0 refills | Status: AC | PRN
  Filled 2024-02-13: qty 20, 7d supply, fill #0
